# Patient Record
Sex: Female | Born: 1980 | Race: White | Hispanic: No | Marital: Married | Smoking: Former smoker
Health system: Southern US, Community
[De-identification: ages and names within clinical notes are randomized; demographics above are authoritative.]

## PROBLEM LIST (undated history)

## (undated) DIAGNOSIS — C50412 Malignant neoplasm of upper-outer quadrant of left female breast: Secondary | ICD-10-CM

## (undated) DIAGNOSIS — Z9221 Personal history of antineoplastic chemotherapy: Secondary | ICD-10-CM

## (undated) HISTORY — DX: Malignant neoplasm of upper-outer quadrant of left female breast: C50.412

## (undated) HISTORY — DX: Personal history of antineoplastic chemotherapy: Z92.21

---

## 2014-08-30 DIAGNOSIS — C50412 Malignant neoplasm of upper-outer quadrant of left female breast: Secondary | ICD-10-CM

## 2014-08-30 DIAGNOSIS — N63 Unspecified lump in unspecified breast: Secondary | ICD-10-CM | POA: Insufficient documentation

## 2014-08-30 HISTORY — DX: Malignant neoplasm of upper-outer quadrant of left female breast: C50.412

## 2014-09-01 DIAGNOSIS — C50419 Malignant neoplasm of upper-outer quadrant of unspecified female breast: Secondary | ICD-10-CM | POA: Insufficient documentation

## 2014-09-04 DIAGNOSIS — C50919 Malignant neoplasm of unspecified site of unspecified female breast: Secondary | ICD-10-CM | POA: Insufficient documentation

## 2014-10-03 DIAGNOSIS — Z79899 Other long term (current) drug therapy: Secondary | ICD-10-CM | POA: Insufficient documentation

## 2015-02-08 DIAGNOSIS — Z9189 Other specified personal risk factors, not elsewhere classified: Secondary | ICD-10-CM | POA: Insufficient documentation

## 2015-02-10 DIAGNOSIS — R0602 Shortness of breath: Secondary | ICD-10-CM | POA: Insufficient documentation

## 2015-03-05 HISTORY — PX: BREAST LUMPECTOMY WITH AXILLARY LYMPH NODE DISSECTION: SHX5756

## 2015-03-22 ENCOUNTER — Telehealth: Payer: Self-pay | Admitting: *Deleted

## 2015-03-22 NOTE — Telephone Encounter (Signed)
Dr. Marjory Lies office at [facility] called.  They are sending CD to [place] today

## 2015-03-27 ENCOUNTER — Ambulatory Visit: Payer: BLUE CROSS/BLUE SHIELD | Attending: Plastic and Reconstructive Surgery | Admitting: Physical Therapy

## 2015-03-27 ENCOUNTER — Encounter: Payer: Self-pay | Admitting: Physical Therapy

## 2015-03-27 DIAGNOSIS — M25612 Stiffness of left shoulder, not elsewhere classified: Secondary | ICD-10-CM | POA: Insufficient documentation

## 2015-03-27 DIAGNOSIS — Z9189 Other specified personal risk factors, not elsewhere classified: Secondary | ICD-10-CM | POA: Diagnosis present

## 2015-03-27 DIAGNOSIS — M25512 Pain in left shoulder: Secondary | ICD-10-CM | POA: Diagnosis present

## 2015-03-27 NOTE — Therapy (Signed)
[place], [place], [address] Phone: [phone]   Fax:  [phone]  Physical Therapy Evaluation  Patient Details  Name: Kathleen Wheeler MRN: [id number] Date of Birth: [birth year] Referring Provider:  Willodean Rosenthal, MD  Encounter Date: 03/27/2015      PT End of Session - 03/27/15 2024    Visit Number 1   Number of Visits 9   Date for PT Re-Evaluation 04/27/15   PT Start Time 2778   PT Stop Time 1528   PT Time Calculation (min) 53 min   Activity Tolerance Patient tolerated treatment well   Behavior During Therapy [facility] for tasks assessed/performed      History reviewed. No pertinent past medical history.  History reviewed. No pertinent past surgical history.  There were no vitals filed for this visit.  Visit Diagnosis:  Stiffness of joint, shoulder region, left - Plan: PT plan of care cert/re-cert  Pain in joint, shoulder region, left - Plan: PT plan of care cert/re-cert  At risk for lymphedema - Plan: PT plan of care cert/re-cert      Subjective Assessment - 03/27/15 1445    Subjective Noticed lump when she stopped nursing her son.  Mentioned it at her routine checkup.   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  Had neo-adjuvant chemotherapy finished 02/06/2015 with lumpectomy and ALND (20 nodes removed, all negative) on 03/05/2015. Treatment was at [facility].   Will start radiation here here; meets with rad onc on 04/04/15.    Otherwise healthy.  Has a son who is 28 months old.   Patient Stated Goals full range of motion in arm and no pain or discomfort; learn about lymphedema   Currently in Pain? Yes   Pain Score 5    Pain Location Axilla  and nipple   Pain Orientation Left   Pain Descriptors / Indicators Burning   Aggravating Factors  touch, moving arm   Pain Relieving Factors ice pack; tylenol            [facility] PT Assessment -  03/27/15 0001    Assessment   Medical Diagnosis left breast cancer, stage IIB   Onset Date/Surgical Date 03/05/15   Hand Dominance Right   Precautions   Precautions Other (comment)   Precaution Comments cancer precautions   Restrictions   Weight Bearing Restrictions No   Other Position/Activity Restrictions none   Balance Screen   Has the patient fallen in the past 6 months No   Has the patient had a decrease in activity level because of a fear of falling?  No   Is the patient reluctant to leave their home because of a fear of falling?  No   Home Social worker Private residence   Living Arrangements Children;Spouse/significant other   Type of [place] to enter   Prior Function   Level of Independence Independent   Vocation Part time employment   Environmental consultant at [employer]; no heavy lifting   Leisure no regular exercise currently; plays outside with her toddler; used to go to the gym a couple times a week; mostly cardio, on elliptical with  light arm/leg resistance   Observation/Other Assessments   Observations two incisions at left lateral breast + one drain site all well healed   Quick DASH  32   ROM / Strength   AROM / PROM / Strength AROM  AROM   Overall AROM Comments learned after surgery to walk arm up wall for abduction   AROM Assessment Site Shoulder   Right/Left Shoulder Right;Left   Right Shoulder Extension 60 Degrees   Right Shoulder Flexion 153 Degrees   Right Shoulder ABduction 196 Degrees   Right Shoulder Internal Rotation 57 Degrees   Right Shoulder External Rotation 120 Degrees   Left Shoulder Extension 62 Degrees   Left Shoulder Flexion 130 Degrees   Left Shoulder ABduction 139 Degrees   Left Shoulder Internal Rotation 73 Degrees   Left Shoulder External Rotation 93 Degrees           LYMPHEDEMA/ONCOLOGY QUESTIONNAIRE - 03/27/15 1511    Type   Cancer Type              Quick Dash - 03/27/15 0001    Open a tight or new jar Mild difficulty   Do heavy household chores (wash walls, wash floors) Mild difficulty   Wash your back Moderate difficulty   Use a knife to cut food Mild difficulty   Recreational activities in which you take some force or impact through your arm, shoulder, or hand (golf, hammering, tennis) Moderate difficulty   During the past week, to what extent has your arm, shoulder or hand problem interfered with your normal social activities with family, friends, neighbors, or groups? Slightly   During the past week, to what extent has your arm, shoulder or hand problem limited your work or other regular daily activities Slightly   Arm, shoulder, or hand pain. Moderate   Tingling (pins and needles) in your arm, shoulder, or hand Moderate   Difficulty Sleeping Moderate difficulty   DASH Score 31.82 %             [facility] Adult PT Treatment/Exercise - 03/27/15 0001    Self-Care   Self-Care Other Self-Care Comments   Other Self-Care Comments  where and how to obtain a compression sleeve; wait to start back at gym workouts, but avoid using arm handles on elliptical if she were to start that; encourage her toddler to climb up on her instead of lifting him up right now                PT Education - 03/27/15 2024    Education provided Yes   Education Details where and how to obtain a compression sleeve for lymphedema risk reduction   Person(s) Educated Patient   Methods Explanation;Handout   Comprehension Verbalized understanding                [place] - 03/27/15 2034    CC Long Term Goal  #1   Title Independent in HEP for left shoulder ROM and in strength ABC program.   Time 4   Period Weeks   Status New   CC Long Term Goal  #2   Title Knowledgeable about lymphedema risk reduction practices.   Time 4   Period Weeks   Status New   CC Long Term Goal  #3   Title Left shoulder active flexion to at least  150 degrees for improved overhead reach.   Baseline 130 at eval compared to 153 on right.   Time 4   Period Weeks   Status New   CC Long Term Goal  #4   Title left shoulder active abduction to at least 175 degrees for improved ADLs.   Baseline 139 on left compared to 196 (!) on right   Time 4  Period Weeks   Status New   CC Long Term Goal  #5   Title Reduce quick DASH score to <20 indicating improved function.   Baseline 32 at eval   Time 4   Period Weeks   Status New            Plan - 03/27/15 2025    Clinical Impression Statement This is a very pleasant young woman 3 weeks s/p lumpectomy and ALND for left breast cancer who reports discomfort and limited ROM and function of left UE.  She had neo-adjuvant chemotherapy and will start radiation soon.  She would like to have full function of her affected arm and would also like to be knowledgeable about lymphedema, which she has heard of but knows little about.  Having had ALND, she is at risk for lymphedema.   Pt will benefit from skilled therapeutic intervention in order to improve on the following deficits Impaired UE functional use;Decreased range of motion;Pain;Decreased knowledge of precautions   Rehab Potential Excellent   Clinical Impairments Affecting Rehab Potential will start radiation treatment soon   PT Frequency 2x / week   PT Duration 4 weeks  as needed   PT Treatment/Interventions ADLs/Self Care Home Management;Passive range of motion;Therapeutic exercise;Manual techniques;DME Instruction;Patient/family education   PT Next Visit Plan Go over ROM HEP (patient should learn some of this at [facility] class on 9/19); check on whether she obtained compression sleeve; begin PROM and manual techniques; later, do strength ABC program   Recommended Other Services to attend ABC class on 04/02/15   Consulted and Agree with Plan of Care Patient         Problem List There are no active problems to display for this  patient.   [place] 03/27/2015, 8:41 PM  [place], [place], [address] Phone: [phone]   Fax:  [place], PT 03/27/2015 8:41 PM

## 2015-03-30 ENCOUNTER — Ambulatory Visit: Payer: BLUE CROSS/BLUE SHIELD | Admitting: Physical Therapy

## 2015-03-30 DIAGNOSIS — M25612 Stiffness of left shoulder, not elsewhere classified: Secondary | ICD-10-CM

## 2015-03-30 DIAGNOSIS — M25512 Pain in left shoulder: Secondary | ICD-10-CM

## 2015-03-30 DIAGNOSIS — Z9189 Other specified personal risk factors, not elsewhere classified: Secondary | ICD-10-CM

## 2015-03-30 NOTE — Therapy (Signed)
[place], [place], [address] Phone: [phone]   Fax:  [phone]  Physical Therapy Treatment  Patient Details  Name: Kathleen Wheeler MRN: [id number] Date of Birth: 04/06/1981 Referring Provider:  Willodean Rosenthal, MD  Encounter Date: 03/30/2015      PT End of Session - 03/30/15 0938    Visit Number 2   Number of Visits 9   Date for PT Re-Evaluation 04/27/15   PT Start Time 0931   PT Stop Time 1015   PT Time Calculation (min) 44 min   Activity Tolerance Patient tolerated treatment well   Behavior During Therapy [place] for tasks assessed/performed      History reviewed. No pertinent past medical history.  History reviewed. No pertinent past surgical history.  There were no vitals filed for this visit.  Visit Diagnosis:  Stiffness of joint, shoulder region, left  Pain in joint, shoulder region, left  At risk for lymphedema      Subjective Assessment - 03/30/15 0936    Subjective No new complaints. Planning to attend ABC class Monday.    Currently in Pain? Yes   Pain Score 4    Pain Location Axilla   Pain Orientation Left   Pain Descriptors / Indicators Burning   Pain Onset 1 to 4 weeks ago   Pain Frequency Constant   Aggravating Factors  morning's,    Pain Relieving Factors tylenol     Treatment: Issued risk reduction for lymphedema sheet after review and education on how to reduce occurrence of lymphedema.  Manual therapy: to left shoulder for increased range of motion Prom all directions (flexion, abduction, IR, ER) with stretch holds at end ranges (within pain free ranges) for increased motions. Myofascial release to  anterior shoulder and axillary area and anterior shoulder for increased tissue extensibility. trigger point/knot palpated at bicep tendon, worked on trigger point release, stretching to release knot.  Exercises Supine with dowel rod: Flexion x 10 reps with holds  at end range for stretching abduction x 10 reps with holds at end range for stretching.        PT Education - 03/30/15 1247    Education provided Yes   Education Details Lymphedema risk reduction    Person(s) Educated Patient   Methods Explanation;Handout   Comprehension Verbalized understanding;Returned demonstration          [facility] Goals - 03/27/15 2034    CC Long Term Goal  #1   Title Independent in HEP for left shoulder ROM and in strength ABC program.   Time 4   Period Weeks   Status New   CC Long Term Goal  #2   Title Knowledgeable about lymphedema risk reduction practices.   Time 4   Period Weeks   Status New   CC Long Term Goal  #3   Title Left shoulder active flexion to at least 150 degrees for improved overhead reach.   Baseline 130 at eval compared to 153 on right.   Time 4   Period Weeks   Status New   CC Long Term Goal  #4   Title left shoulder active abduction to at least 175 degrees for improved ADLs.   Baseline 139 on left compared to 196 (!) on right   Time 4   Period Weeks   Status New   CC Long Term Goal  #5   Title Reduce quick DASH score to <20 indicating improved function.   Baseline 32  at eval   Time 4   Period Weeks   Status New            Plan - 03/30/15 0938    Pt will benefit from skilled therapeutic intervention in order to improve on the following deficits Impaired UE functional use;Decreased range of motion;Pain;Decreased knowledge of precautions   Rehab Potential Excellent   Clinical Impairments Affecting Rehab Potential will start radiation treatment soon   PT Frequency 2x / week   PT Duration 4 weeks  as needed   PT Treatment/Interventions ADLs/Self Care Home Management;Passive range of motion;Therapeutic exercise;Manual techniques;DME Instruction;Patient/family education   Consulted and Agree with Plan of Care Patient        Problem List There are no active problems to display for this patient.   Willow Ora 03/30/2015, 12:47 PM  Willow Ora, PTA, [place] [address] [place], [place] [address] [phone] 03/30/2015, 12:47 PM

## 2015-04-03 ENCOUNTER — Telehealth: Payer: Self-pay | Admitting: *Deleted

## 2015-04-03 ENCOUNTER — Ambulatory Visit: Payer: BLUE CROSS/BLUE SHIELD

## 2015-04-03 NOTE — Telephone Encounter (Signed)
CD from [facility] arrived 03/26/15  Gave to Dr. Pablo Ledger

## 2015-04-03 NOTE — Progress Notes (Signed)
Location of Breast Cancer:Upper-outer  quadrant  of left breast 5 mm. 7 o'clock  Histology per Pathology Report:Invasive ductal carcinoma 03/06/15 Left axillary lymph node negative for metastatic carcinoma. Multiple lymph nodes negative for metastatic carcinoma. Left breast excision reveals no residual carcinoma  Receptor Status: ER(+), PR (-), Her2-neu (-)  Did patient present with symptoms (if so, please note symptoms) or was this found on screening mammography?:Patient palpated a nodule in breast which she thought was a blocked milk duct and upon examination another area was visible in axilla which positive for cancer.  Past/Anticipated interventions by surgeon, if any: 03/05/15 1. Left axillary lymph node dissection 2.left partial mastectomy   Past/Anticipated interventions by medical oncology, if any: Chemotherapy:Weekly taxol and carboplatin. BRCA 1&2 negative  Plan to start tamoxifen 2 weeks after completion of radiation therapy.  Lymphedema issues, if any:No  Pain issues, if any: Discomfort.Removal of porta-cath on 04/04/2015.   SAFETY ISSUES:  Prior radiation? No  Pacemaker/ICD? No  Possible current pregnancy?Pre-menopausal  Is the patient on methotrexate? No  Current Complaints / other details:Married. Menarche age 42.G1P1.first full-term pregnacy age 28, a son. Family history negative for breast or female organ cancer. NKDA ?Great Aunts BP 110/61 mmHg  Pulse 70  Temp(Src) 97.6 F (36.4 C)  Ht 5' 9" (1.753 m)  Wt 155 lb 11.2 oz (70.625 kg)  BMI 22.98 kg/m2  SpO2 100%    Arlyss Repress, RN 04/03/2015,12:58 PM

## 2015-04-04 ENCOUNTER — Encounter: Payer: Self-pay | Admitting: Radiation Oncology

## 2015-04-04 ENCOUNTER — Ambulatory Visit
Admission: RE | Admit: 2015-04-04 | Discharge: 2015-04-04 | Disposition: A | Discharge status: Home / Self Care | Payer: BLUE CROSS/BLUE SHIELD | Source: Ambulatory Visit | Attending: Radiation Oncology | Admitting: Radiation Oncology

## 2015-04-04 VITALS — BP 110/61 | HR 70 | Temp 97.6°F | Ht 69.0 in | Wt 155.7 lb

## 2015-04-04 DIAGNOSIS — Z510 Encounter for antineoplastic radiation therapy: Secondary | ICD-10-CM | POA: Insufficient documentation

## 2015-04-04 DIAGNOSIS — C50912 Malignant neoplasm of unspecified site of left female breast: Secondary | ICD-10-CM | POA: Insufficient documentation

## 2015-04-04 DIAGNOSIS — Z170 Estrogen receptor positive status [ER+]: Secondary | ICD-10-CM | POA: Insufficient documentation

## 2015-04-04 DIAGNOSIS — C50412 Malignant neoplasm of upper-outer quadrant of left female breast: Secondary | ICD-10-CM

## 2015-04-04 DIAGNOSIS — Z9221 Personal history of antineoplastic chemotherapy: Secondary | ICD-10-CM | POA: Insufficient documentation

## 2015-04-04 DIAGNOSIS — Z9889 Other specified postprocedural states: Secondary | ICD-10-CM | POA: Insufficient documentation

## 2015-04-04 DIAGNOSIS — R590 Localized enlarged lymph nodes: Secondary | ICD-10-CM | POA: Insufficient documentation

## 2015-04-04 NOTE — Progress Notes (Signed)
Please see the Nurse Progress Note in the MD Initial Consult Encounter for this patient. 

## 2015-04-04 NOTE — Progress Notes (Signed)
Radiation Oncology         [phone]) [phone] ________________________________  Initial Outpatient Consultation - Date: 04/04/2015   Name: Kathleen Wheeler MRN: [id number]   DOB: [birth year]  REFERRING PHYSICIAN: Antonieta Pert*  DIAGNOSIS AND STAGE: T2N1 Invasive Ductal Carcinoma of the Left Breast  HISTORY OF PRESENT ILLNESS::Kathleen Wheeler is a 34 y.o. female who had a 1 month history of a palpable left breast lump. A mammogram and ultrasound came back abnormal. A biopsy of the left breast and an axillary lymph node on 08/30/14 showed Grade III invasive ductal carcinoma ER 2%, PR 2%, and HER2 negative. The lymph node was positive. A repeat IHC at [facility] was ER positive, PR negative, and HER2 negative. MRI of the breasts on 09/11/14 showed bilateral enhancing foci, the mass in the left breast measured 2.6 cm. PET/CT on 09/13/14 showed no evidence of distant metastases. She has completed neoadjuvant chemotherapy with 4 cycles of adriamycin/cyclophosphamide on 10/31/14 and and weekly taxol/carbo on 02/06/15. PET scan on 02/08/15 showed left lateral breasts masses and lymphadenopathy.  On 02/27/15, a biopsy of the left breast came back as negative and on 03/05/15 she had a lumpectomy and an axillary node dissection that showed 0 out of 15 left axillary lymph nodes positive. Her lumpectomy showed no evidence for residual carcinoma. She has been referred to physical therapy. She is now ready to begin radiation. Her care up until now has been at [facility] under the care of [place] and Dr. Rip Harbour. She lives in [place] and prefer to receive her radiation treatment closer to home.  She is accompanied by her husband. She is ecstatic regarding her cosmetic result and pathologic complete response.  She has healed well and would like to proceed with radiation ASAP. She had her port removed yesterday. She reports a "tightness" in her left shoulder when she moves her arm and is working with physical therapy.   PREVIOUS  RADIATION THERAPY: No  Past medical, social and family history were reviewed in the electronic chart. Review of symptoms was reviewed in the electronic chart. Medications were reviewed in the electronic chart.     PHYSICAL EXAM:  Filed Vitals:   04/04/15 1514  BP: 110/61  Pulse: 70  Temp: 97.6 F (36.4 C)  .155 lb 11.2 oz (70.625 kg). Excellent cosmetic result. Incision in the upper outer quadrant of the left breast that is well healed and a axillary incision underneath the left arm that is well healed. No palpable abnormalities of the right breast. No palpable cervical, supraclavicular, or axillary adenopathy. Excellent ROM in both arms. Alert and oriented times three.  IMPRESSION: T2N1 Invasive Ductal Carcinoma of the Left Breast  PLAN: I spoke to the patient today regarding her diagnosis and options for treatment. We discussed the equivalence in terms of survival and local failure between mastectomy and breast conservation. We discussed the role of radiation in decreasing local failures in patients who undergo lumpectomy, even in the setting of a pathologic complete response.  We discussed the retrospective series showing unacceptable failure rates in patients who did not receive radiation after a pathologic complete response. . We discussed the process of simulation and the placement tattoos. We discussed 6 weeks of treatment as an outpatient including her breast, supraclavicular foss. We discussed the possibility of asymptomatic lung damage. We discussed the low likelihood of secondary malignancies. We discussed the possible side effects including but not limited to skin redness, fatigue, permanent skin darkening, and breast swelling.  We discussed the  use of cardiac sparing with deep inspiration breath hold if needed.  She signed informed consent and will be scheduled for simulation this week or next.   I spent 40 minutes  face to face with the patient and more than 50% of that time was  spent in counseling and/or coordination of care.  This document serves as a record of services personally performed by Thea Silversmith, MD. It was created on her behalf by Darcus Austin, a trained medical scribe. The creation of this record is based on the scribe's personal observations and the provider's statements to them. This document has been checked and approved by the attending provider.   ------------------------------------------------  Thea Silversmith, MD

## 2015-04-05 ENCOUNTER — Ambulatory Visit: Payer: BLUE CROSS/BLUE SHIELD | Admitting: Physical Therapy

## 2015-04-05 ENCOUNTER — Encounter: Payer: Self-pay | Admitting: Physical Therapy

## 2015-04-05 DIAGNOSIS — M25612 Stiffness of left shoulder, not elsewhere classified: Secondary | ICD-10-CM

## 2015-04-05 DIAGNOSIS — M25512 Pain in left shoulder: Secondary | ICD-10-CM

## 2015-04-05 DIAGNOSIS — Z9189 Other specified personal risk factors, not elsewhere classified: Secondary | ICD-10-CM

## 2015-04-05 NOTE — Patient Instructions (Addendum)
Closed Chain: Shoulder Abduction / Adduction - on Wall   One hand on wall, step to side and return. Stepping causes shoulder to abduct and adduct. Step _3__ times, holding 10 seconds on the left side, _3__ times per day.  http://ss.exer.us/267   Copyright  VHI. All rights reserved.  Closed Chain: Shoulder Flexion / Extension - on Wall   Hands on wall, step backward. Return. Stepping causes shoulder flexion and extension Do _3__ times,  Hold 10 seconds,  _3__ times per day.  http://ss.exer.us/265   Copyright  VHI. All rights reserved.  Scapular Retraction (Standing)   With arms at sides, pinch shoulder blades together. Repeat _10___ times per set. Do __1__ sets per session. Do __3__ sessions per day.  http://orth.exer.us/945   Copyright  VHI. All rights reserved.

## 2015-04-06 NOTE — Therapy (Signed)
[place], [place], [address] Phone: [phone]   Fax:  [phone]  Physical Therapy Treatment  Patient Details  Name: Kathleen Wheeler MRN: [id number] Date of Birth: 05/22/1981 Referring Provider:  Willodean Rosenthal, MD  Encounter Date: 04/05/2015    History reviewed. No pertinent past medical history.  History reviewed. No pertinent past surgical history.  There were no vitals filed for this visit.  Visit Diagnosis:  Stiffness of joint, shoulder region, left  Pain in joint, shoulder region, left  At risk for lymphedema   Treatment: Therapeutic exercise -  Shoulder pulleys - flexion and abduction x2 minutes each with verbal cues for proper posture and technique. Rolling ball up wall to end ROM flexion x10 and abduction x10 after PT demo with verbal cues for technique. Scapular retraction x10 in front of mirror for self-correction and verbal cues to avoid shoulder hiking. Finger ladder - flexion and abduction x5 each to comfortable end ROM.  Education: Instructed patient with a home exercise program including shoulder stretching and scapular retraction with PT verbal cues, demonstration and handout.  Patient verbalized understanding and returned demo.  Manual therapy: PROM left shoulder in supine to patient tolerance al planes focused on flexion and abduction. Passive neural stretch to left UE.  PLAN: Clinical Impression Statement:      Patient is limited with flexion and abduction but progressing well. Limited some by port being removed and having some soreness there. She will benefit from continued PT to increase ROM and progress to strengthening after ROM is back to baseline.taken yesterday   Pt will benefit from skilled therapeutic intervention in order to improve on the following deficits:    Impaired UE functional use; Decreased range of motion; Pain; Decreased knowledge of  precautions            Rehab Potential       Excellent    PT Next Visit Plan       Continue ROM exercises and PROM; neural tension stretching       [facility] Goals - 03/27/15 2034    CC Long Term Goal  #1   Title Independent in HEP for left shoulder ROM and in strength ABC program.   Time 4   Period Weeks   Status New   CC Long Term Goal  #2   Title Knowledgeable about lymphedema risk reduction practices.   Time 4   Period Weeks   Status New   CC Long Term Goal  #3   Title Left shoulder active flexion to at least 150 degrees for improved overhead reach.   Baseline 130 at eval compared to 153 on right.   Time 4   Period Weeks   Status New   CC Long Term Goal  #4   Title left shoulder active abduction to at least 175 degrees for improved ADLs.   Baseline 139 on left compared to 196 (!) on right   Time 4   Period Weeks   Status New   CC Long Term Goal  #5   Title Reduce quick DASH score to <20 indicating improved function.   Baseline 32 at eval   Time 4   Period Weeks   Status New            Problem List Patient Active Problem List   Diagnosis Date Noted  . Breath shortness 02/10/2015  . At risk for physiological dysfunction 02/08/2015  . Polypharmacy 10/03/2014  . Infiltrating  ductal carcinoma of breast 09/04/2014  . Cancer of upper-outer quadrant of female breast 09/01/2014  . Breast lump 08/30/2014   Annia Friendly, PT 04/06/2015 8:18 PM  [place], [place], [address] Phone: [phone]   Fax:  [phone]

## 2015-04-10 ENCOUNTER — Ambulatory Visit: Payer: BLUE CROSS/BLUE SHIELD | Admitting: Physical Therapy

## 2015-04-10 DIAGNOSIS — Z9189 Other specified personal risk factors, not elsewhere classified: Secondary | ICD-10-CM

## 2015-04-10 DIAGNOSIS — M25612 Stiffness of left shoulder, not elsewhere classified: Secondary | ICD-10-CM

## 2015-04-10 DIAGNOSIS — M25512 Pain in left shoulder: Secondary | ICD-10-CM

## 2015-04-10 NOTE — Therapy (Signed)
[place], [place], [address] Phone: [phone]   Fax:  [phone]  Physical Therapy Treatment  Patient Details  Name: Kathleen Wheeler MRN: [id number] Date of Birth: 1981/04/30 Referring Provider:  Willodean Rosenthal, MD  Encounter Date: 04/10/2015      PT End of Session - 04/10/15 1619    Visit Number 4   Number of Visits 9   Date for PT Re-Evaluation 04/27/15   PT Start Time 6415   PT Stop Time 1608   PT Time Calculation (min) 45 min   Activity Tolerance Patient tolerated treatment well;Patient limited by pain   Behavior During Therapy [facility] for tasks assessed/performed      No past medical history on file.  No past surgical history on file.  There were no vitals filed for this visit.  Visit Diagnosis:  Stiffness of joint, shoulder region, left  Pain in joint, shoulder region, left  At risk for lymphedema      Subjective Assessment - 04/10/15 1524    Subjective Port removal incision is feeling better, less sore than it was.  I went and got my compression sleeve--it actually feels good.  Doing exercise twice a day.   Currently in Pain? Yes   Pain Score 3    Pain Location Axilla   Pain Orientation Left   Pain Descriptors / Indicators Numbness;Discomfort            [facility] PT Assessment - 04/10/15 0001    AROM   Left Shoulder Flexion 151 Degrees   Left Shoulder ABduction 175 Degrees                     [facility] Adult PT Treatment/Exercise - 04/10/15 0001    Lumbar Exercises: Supine   Other Supine Lumbar Exercises With arms in 90 degrees abduction, hooklying lower trunk rotation to right for left chest stretches.   Shoulder Exercises: Supine   Other Supine Exercises supine over foam roll, horizontal abduction prolonged hold with stretch x approx. 30 seconds; "V" shape with arms with prolonged hold x 5   Manual Therapy   Manual Therapy Myofascial release   Myofascial  Release Left UE myofascial pulling in supine to right sidelying, with left scapular mobilization in sidelying.   Passive ROM PROM left shoulder all planes in supine focused on flexion and abduction                PT Education - 04/10/15 1618    Education provided Yes   Education Details shoulder horizontal abduction stretches with lower trunk rotation to right, and over towel roll   Person(s) Educated Patient   Methods Explanation;Handout   Comprehension Returned demonstration                [facility] Goals - 04/10/15 1527    CC Long Term Goal  #1   Title Independent in HEP for left shoulder ROM and in strength ABC program.   Status Partially Met   CC Long Term Goal  #2   Title Knowledgeable about lymphedema risk reduction practices.   Status Achieved   CC Long Term Goal  #3   Title Left shoulder active flexion to at least 150 degrees for improved overhead reach.   Status Achieved   CC Long Term Goal  #4   Title left shoulder active abduction to at least 175 degrees for improved ADLs.   Status Achieved   CC Long Term Goal  #  5   Title Reduce quick DASH score to <20 indicating improved function.   Status On-going            Plan - 04/10/15 1619    Clinical Impression Statement Patient doing very well with treatment.  She has met 3 of 5 original goals, including shoulder AROM goals.  She reports improved function and feeling less discomfort compared to initial evaluation.  She reports doing HEP regularly.  She canceled this Friday's appointment and has two appointments next week.  We discussed that at that time, we may put her on hold a couple of weeks, and just have her check back in after that to make sure she continues to be progressing.   Pt will benefit from skilled therapeutic intervention in order to improve on the following deficits Impaired UE functional use;Decreased range of motion;Pain;Decreased knowledge of precautions   Rehab Potential  Excellent   Clinical Impairments Affecting Rehab Potential will start radiation treatment soon   PT Frequency 2x / week   PT Duration 4 weeks   PT Treatment/Interventions Passive range of motion;Manual techniques;Therapeutic exercise;Patient/family education   PT Next Visit Plan Repeat quick DASH.  Continue manual therapy for ROM; patient probably has enough home exercises, but consider instructing in strength ABC program for her to begin progressive strengthening.  She may be ready after two visits next week to be on hold for a couple of weeks or so, then to have a follow-up just to make sure she continues to progress and has not developed swelling.                             PT Home Exercise Plan See HEP   Consulted and Agree with Plan of Care Patient        Problem List Patient Active Problem List   Diagnosis Date Noted  . Breath shortness 02/10/2015  . At risk for physiological dysfunction 02/08/2015  . Polypharmacy 10/03/2014  . Infiltrating ductal carcinoma of breast 09/04/2014  . Cancer of upper-outer quadrant of female breast 09/01/2014  . Breast lump 08/30/2014    SALISBURY,DONNA 04/10/2015, 4:26 PM  [place], [place], [address] Phone: [phone]   Fax:  [place], PT 04/10/2015 4:26 PM

## 2015-04-10 NOTE — Patient Instructions (Signed)
Supine With Rotation   Lie on back with one knee drawn toward chest. Slowly bring bent leg across body until stretch is felt in lower back area  OR you can have both knees bent and drop them over to one side. Hold _30__ seconds. Repeat to other side. Repeat _1-2__ times per session. Do __2_ sessions per day.   Also, you can roll up a couple of bath towels and lie on that towel roll (towel roll along the spine, on the bed) with arms out to the sides. Feel a stretch at chest.  Hold 30 seconds, 1-2 times, once or twice a day.  Copyright  VHI. All rights reserved.

## 2015-04-12 ENCOUNTER — Ambulatory Visit
Admission: RE | Admit: 2015-04-12 | Discharge: 2015-04-12 | Disposition: A | Discharge status: Home / Self Care | Payer: BLUE CROSS/BLUE SHIELD | Source: Ambulatory Visit | Attending: Radiation Oncology | Admitting: Radiation Oncology

## 2015-04-12 DIAGNOSIS — Z510 Encounter for antineoplastic radiation therapy: Secondary | ICD-10-CM | POA: Diagnosis not present

## 2015-04-12 DIAGNOSIS — R590 Localized enlarged lymph nodes: Secondary | ICD-10-CM | POA: Diagnosis not present

## 2015-04-12 DIAGNOSIS — Z170 Estrogen receptor positive status [ER+]: Secondary | ICD-10-CM | POA: Diagnosis not present

## 2015-04-12 DIAGNOSIS — Z9889 Other specified postprocedural states: Secondary | ICD-10-CM | POA: Diagnosis not present

## 2015-04-12 DIAGNOSIS — Z9221 Personal history of antineoplastic chemotherapy: Secondary | ICD-10-CM | POA: Diagnosis not present

## 2015-04-12 DIAGNOSIS — C50412 Malignant neoplasm of upper-outer quadrant of left female breast: Secondary | ICD-10-CM

## 2015-04-12 DIAGNOSIS — C50912 Malignant neoplasm of unspecified site of left female breast: Secondary | ICD-10-CM | POA: Diagnosis present

## 2015-04-12 NOTE — Progress Notes (Signed)
Name: Kathleen Wheeler   MRN: [id number]  Date:  04/12/2015  DOB: [birth year]  Status:outpatient   DIAGNOSIS: Left Breast cancer  CONSENT VERIFIED: yes SET UP: Patient is setup supine  IMMOBILIZATION:  The following immobilization was used:Custom Moldable Pillow, breast board.  NARRATIVE: Ms. Kathleen Wheeler was brought to the [facility].  Identity was confirmed.  All relevant records and images related to the planned course of therapy were reviewed.  Then, the patient was positioned in a stable reproducible clinical set-up for radiation therapy.  Wires were placed to delineate the clinical extent of breast tissue. A wire was placed on the scar as well.  CT images were obtained.  An isocenter was placed. Skin markings were placed.  The position of the heart was then analyzed.  Due to the proximity of the heart to the chest wall, I felt she would benefit from deep inspiration breath hold for cardiac sparing.  She was then coached and rescanned in the breath hold position.  Acceptable cardiac sparing was achieved. The CT images were loaded into the planning software where the target and avoidance structures were contoured.  The radiation prescription was entered and confirmed. The patient was discharged in stable condition and tolerated simulation well.    TREATMENT PLANNING NOTE/3D Simulation Note Treatment planning then occurred. I have requested : MLC's, isodose plan, basic dose calculation  3D simulation was performed.  I personally constructed 3 complex treatment devices in the form of MLCs which will be used for beam modification and to protect critical structures including the heart and lung.  I have requested a dose volume histogram of the heart lung and tumor cavity.    Special treatment procedure was performed today due to the extra time and effort required by myself to plan and prepare this patient for deep inspiration breath hold technique.  I have determined cardiac sparing to  be of benefit to this patient to prevent long term cardiac damage due to radiation of the heart.  Bellows were placed on the patient's abdomen. To facilitate cardiac sparing, the patient was coached by the radiation therapists on breath hold techniques and breathing practice was performed. Practice waveforms were obtained. The patient was then scanned while maintaining breath hold in the treatment position.  This image was then transferred over to the imaging specialist. The imaging specialist then created a fusion of the free breathing and breath hold scans using the chest wall as the stable structure. I personally reviewed the fusion in axial, coronal and sagittal image planes.  Excellent cardiac sparing was obtained.  I felt the patient is an appropriate candidate for breath hold and the patient will be treated as such.  The image fusion was then reviewed with the patient to reinforce the necessity of reproducible breath hold.    Radiation Oncology         [phone] ________________________________  Name: Kathleen Wheeler MRN: [id number]  Date: 04/12/2015  DOB: October 19, 1980  Optical Surface Tracking Plan:  Since intensity modulated radiotherapy (IMRT) and 3D conformal radiation treatment methods are predicated on accurate and precise positioning for treatment, intrafraction motion monitoring is medically necessary to ensure accurate and safe treatment delivery.  The ability to quantify intrafraction motion without excessive ionizing radiation dose can only be performed with optical surface tracking. Accordingly, surface imaging offers the opportunity to obtain 3D measurements of patient position throughout IMRT and 3D treatments without excessive radiation exposure.  I am ordering optical surface tracking for this patient's upcoming  course of radiotherapy. ________________________________  Toney Sang, MD 04/12/2015 8:32 AM    Reference:   Particia Wheeler, et al.  Surface imaging-based analysis of intrafraction motion for breast radiotherapy patients.Journal of [place], n. 6, nov. 2014. ISSN 30051102.   Available at: <http://www.jacmp.org/index.php/jacmp/article/view/4957>.

## 2015-04-13 ENCOUNTER — Ambulatory Visit: Payer: BLUE CROSS/BLUE SHIELD | Admitting: Physical Therapy

## 2015-04-16 ENCOUNTER — Ambulatory Visit: Payer: BLUE CROSS/BLUE SHIELD | Attending: Plastic and Reconstructive Surgery | Admitting: Physical Therapy

## 2015-04-16 DIAGNOSIS — Z9189 Other specified personal risk factors, not elsewhere classified: Secondary | ICD-10-CM | POA: Diagnosis present

## 2015-04-16 DIAGNOSIS — M25612 Stiffness of left shoulder, not elsewhere classified: Secondary | ICD-10-CM | POA: Diagnosis not present

## 2015-04-16 DIAGNOSIS — M25512 Pain in left shoulder: Secondary | ICD-10-CM

## 2015-04-16 NOTE — Therapy (Signed)
[place], [place], [address] Phone: [phone]   Fax:  [phone]  Physical Therapy Treatment  Patient Details  Name: Kathleen Wheeler MRN: [id number] Date of Birth: Feb 05, 1981 Referring Provider:  Willodean Rosenthal, MD  Encounter Date: 04/16/2015      PT End of Session - 04/16/15 1625    Visit Number 5   Number of Visits 6  (to 9 if needed)   Date for PT Re-Evaluation 04/27/15   PT Start Time 1520   PT Stop Time 1610   PT Time Calculation (min) 50 min   Activity Tolerance Patient tolerated treatment well   Behavior During Therapy [facility] for tasks assessed/performed      No past medical history on file.  No past surgical history on file.  There were no vitals filed for this visit.  Visit Diagnosis:  Stiffness of joint, shoulder region, left  Pain in joint, shoulder region, left  At risk for lymphedema      Subjective Assessment - 04/16/15 1523    Subjective "I feel like I'm back to normal."   Currently in Pain? Yes   Pain Score 2    Pain Location Axilla   Pain Orientation Right   Pain Descriptors / Indicators Tender   Aggravating Factors  pressing on it   Pain Relieving Factors rest            [facility] PT Assessment - 04/16/15 0001    Observation/Other Assessments   Quick DASH  6.82              Quick Dash - 04/16/15 0001    Open a tight or new jar No difficulty   Do heavy household chores (wash walls, wash floors) No difficulty   Carry a shopping bag or briefcase No difficulty   Wash your back No difficulty   Use a knife to cut food No difficulty   Recreational activities in which you take some force or impact through your arm, shoulder, or hand (golf, hammering, tennis) No difficulty   During the past week, to what extent has your arm, shoulder or hand problem interfered with your normal social activities with family, friends, neighbors, or groups? Not at all   During the past week, to what extent has your arm, shoulder or hand problem limited your work or other regular daily activities Not at all   Arm, shoulder, or hand pain. Mild   Tingling (pins and needles) in your arm, shoulder, or hand Mild   Difficulty Sleeping Mild difficulty   DASH Score 6.82 %               [facility] Adult PT Treatment/Exercise - 04/16/15 0001    Exercises   Exercises Other Exercises   Other Exercises  Instructed in and patient performed strength ABC program:  all stretches (chest, shoulder, tricep, calf, hamstring, butterfly, quads, and back rotation), core (bridging, curls, superwoman, 10 reps each), and resistance exercises (chest press 2 lbs. x 10 x 2, squats 0 lbs. x 10 x 2, one arm row 2 lbs. x 10 x 2, standing side leg lift 0 lbs. x 10 x 2, scaption against wall 2 lbs. x 10 x 2, steps 0 lbs. x 10 x 2 each side, tricep kickbacks 2 lbs. x 10 x 2, calf raises 0 lbs. x 10 x 2, and bicep curls 3 lbs. x 10 x 2).  PT Education - 04/16/15 1625    Education provided Yes   Education Details strength ABC program exercises, how to progress, and using log   Person(s) Educated Patient   Methods Explanation;Demonstration;Handout   Comprehension Verbalized understanding;Returned demonstration                [facility] Goals - 04/16/15 1630    CC Long Term Goal  #5   Title Reduce quick DASH score to <20 indicating improved function.   Status Achieved            Plan - 04/16/15 1627    Clinical Impression Statement Patient's quick DASH score has improved to just 6.82 today and she reports feeling back to normal.  She did well learning strength ABC program, but was challenged with "superwoman" core exercise and with light weigh resistance on several of the resistive exercises.  She will most likely be ready to be put on hold at next visit for a couple of weeks, then follow-up with one check-up visit.Marland Kitchen                                                                                                                    Pt will benefit from skilled therapeutic intervention in order to improve on the following deficits Impaired UE functional use;Decreased range of motion;Pain;Decreased knowledge of precautions   Rehab Potential Excellent   PT Frequency 2x / week   PT Duration 4 weeks   PT Treatment/Interventions Therapeutic exercise;Patient/family education   PT Next Visit Plan Review strength ABC program if needed.  Check all goals.  Continue manual therapy for stretching.   Consulted and Agree with Plan of Care Patient        Problem List Patient Active Problem List   Diagnosis Date Noted  . Breath shortness 02/10/2015  . At risk for physiological dysfunction 02/08/2015  . Polypharmacy 10/03/2014  . Infiltrating ductal carcinoma of breast ([place]) 09/04/2014  . Cancer of upper-outer quadrant of female breast ([place]) 09/01/2014  . Breast lump 08/30/2014    Trendon Zaring 04/16/2015, 4:32 PM  [place], [place], [address] Phone: [phone]   Fax:  [place], PT 04/16/2015 4:32 PM

## 2015-04-18 ENCOUNTER — Ambulatory Visit: Payer: BLUE CROSS/BLUE SHIELD | Admitting: Physical Therapy

## 2015-04-18 DIAGNOSIS — Z9221 Personal history of antineoplastic chemotherapy: Secondary | ICD-10-CM | POA: Diagnosis not present

## 2015-04-18 DIAGNOSIS — R590 Localized enlarged lymph nodes: Secondary | ICD-10-CM | POA: Diagnosis not present

## 2015-04-18 DIAGNOSIS — M25612 Stiffness of left shoulder, not elsewhere classified: Secondary | ICD-10-CM | POA: Diagnosis not present

## 2015-04-18 DIAGNOSIS — C50912 Malignant neoplasm of unspecified site of left female breast: Secondary | ICD-10-CM | POA: Diagnosis present

## 2015-04-18 DIAGNOSIS — M25512 Pain in left shoulder: Secondary | ICD-10-CM

## 2015-04-18 DIAGNOSIS — Z170 Estrogen receptor positive status [ER+]: Secondary | ICD-10-CM | POA: Diagnosis not present

## 2015-04-18 DIAGNOSIS — Z9189 Other specified personal risk factors, not elsewhere classified: Secondary | ICD-10-CM

## 2015-04-18 DIAGNOSIS — Z9889 Other specified postprocedural states: Secondary | ICD-10-CM | POA: Diagnosis not present

## 2015-04-18 DIAGNOSIS — Z510 Encounter for antineoplastic radiation therapy: Secondary | ICD-10-CM | POA: Diagnosis not present

## 2015-04-18 NOTE — Therapy (Addendum)
[place], [place], [address] Phone: [phone]   Fax:  [phone]  Physical Therapy Treatment  Patient Details  Name: Kathleen Wheeler MRN: [id number] Date of Birth: [birth year] Referring Provider:  Willodean Rosenthal, MD  Encounter Date: 04/18/2015    Past Medical History  Diagnosis Date  . Breast cancer of upper-outer quadrant of left female breast ([place]) 08/30/2014  . History of antineoplastic chemotherapy 09/18/2014-02/06/2015    doxorubicin / cyclophosphamide x 4 cycles, followed by weekly paclitaxel / carboplatin x 12 (carbo stopped after 11the cycle due to neutropenia)    Past Surgical History  Procedure Laterality Date  . Breast lumpectomy with axillary lymph node dissection  03/05/2015    [facility] - Dr. Stasia Cavalier    There were no vitals filed for this visit.  Visit Diagnosis:  Stiffness of joint, shoulder region, left  Pain in joint, shoulder region, left  At risk for lymphedema                                       [facility] Clinic Goals - 04/18/15 1044    CC Long Term Goal  #1   Title Independent in HEP for left shoulder ROM and in strength ABC program.   Status Achieved   CC Long Term Goal  #2   Title Knowledgeable about lymphedema risk reduction practices.   Status Achieved   CC Long Term Goal  #3   Title Left shoulder active flexion to at least 150 degrees for improved overhead reach.   Status Achieved   CC Long Term Goal  #4   Title left shoulder active abduction to at least 175 degrees for improved ADLs.   Status Achieved   CC Long Term Goal  #5   Title Reduce quick DASH score to <20 indicating improved function.   Status Achieved            Problem List Patient Active Problem List   Diagnosis Date Noted  . Breath shortness 02/10/2015  . At risk for physiological dysfunction 02/08/2015  . Polypharmacy 10/03/2014  . Infiltrating ductal  carcinoma of breast ([place]) 09/04/2014  . Cancer of upper-outer quadrant of female breast ([place]) 09/01/2014  . Breast lump 08/30/2014    SALISBURY,DONNA 09/13/2015, 3:08 PM  [place], [place], [address] Phone: [phone]   Fax:  [place], PT 09/13/2015 3:08 PM  PHYSICAL THERAPY DISCHARGE SUMMARY  Visits from Start of Care: 6  Current functional level related to goals / functional outcomes: All goals achieved as noted above.   Remaining deficits: None; patient should continue to stretch to maintain or improve ROM of shoulder.   Education / Equipment: Home exercise program; lymphedema risk reduction education. Plan: Patient agrees to discharge.  Patient goals were met. Patient is being discharged due to meeting the stated rehab goals.  ?????  Patient called a few weeks after this last visit to report she was doing well and did not need to come in for follow-up.          Serafina Royals, PT 09/13/2015 3:08 PM

## 2015-04-19 ENCOUNTER — Ambulatory Visit
Admission: RE | Admit: 2015-04-19 | Discharge: 2015-04-19 | Disposition: A | Discharge status: Home / Self Care | Payer: BLUE CROSS/BLUE SHIELD | Source: Ambulatory Visit | Attending: Radiation Oncology | Admitting: Radiation Oncology

## 2015-04-19 DIAGNOSIS — Z510 Encounter for antineoplastic radiation therapy: Secondary | ICD-10-CM | POA: Diagnosis not present

## 2015-04-23 ENCOUNTER — Ambulatory Visit
Admission: RE | Admit: 2015-04-23 | Discharge: 2015-04-23 | Disposition: A | Discharge status: Home / Self Care | Payer: BLUE CROSS/BLUE SHIELD | Source: Ambulatory Visit | Attending: Radiation Oncology | Admitting: Radiation Oncology

## 2015-04-23 DIAGNOSIS — Z510 Encounter for antineoplastic radiation therapy: Secondary | ICD-10-CM | POA: Diagnosis not present

## 2015-04-24 ENCOUNTER — Encounter: Payer: Self-pay | Admitting: Radiation Oncology

## 2015-04-24 ENCOUNTER — Ambulatory Visit
Admission: RE | Admit: 2015-04-24 | Discharge: 2015-04-24 | Disposition: A | Discharge status: Home / Self Care | Payer: BLUE CROSS/BLUE SHIELD | Source: Ambulatory Visit | Attending: Radiation Oncology | Admitting: Radiation Oncology

## 2015-04-24 VITALS — BP 121/61 | HR 60 | Temp 97.8°F | Ht 69.0 in | Wt 152.8 lb

## 2015-04-24 DIAGNOSIS — Z510 Encounter for antineoplastic radiation therapy: Secondary | ICD-10-CM | POA: Diagnosis not present

## 2015-04-24 DIAGNOSIS — C50412 Malignant neoplasm of upper-outer quadrant of left female breast: Secondary | ICD-10-CM

## 2015-04-24 MED ORDER — RADIAPLEXRX EX GEL
Freq: Once | CUTANEOUS | Status: AC
  Administered 2015-04-24: 13:00:00 via TOPICAL

## 2015-04-24 MED ORDER — ALRA NON-METALLIC DEODORANT (RAD-ONC)
1.0000 "application " | Freq: Once | TOPICAL | Status: AC
  Administered 2015-04-24: 1 via TOPICAL

## 2015-04-24 NOTE — Progress Notes (Signed)
Weekly Management Note Current Dose: 3.6  Gy  Projected Dose: 61 Gy   Narrative:  The patient presents for routine under treatment assessment.  CBCT/MVCT images/Port film x-rays were reviewed.  The chart was checked. Anxious. Back pain x 2 weeks.   Physical Findings: Weight: 152 lb 12.8 oz (69.31 kg). Unchanged  Impression:  The patient is tolerating radiation.  Plan:  Continue treatment as planned. Try Ativan prior to RT. Monitor back pain. Will ask for massages. Refer to [facility] and survivorship

## 2015-04-24 NOTE — Addendum Note (Signed)
Encounter addended by: Thea Silversmith, MD on: 04/24/2015  7:32 PM<BR>     Documentation filed: Follow-up Section, LOS Section, Notes Section

## 2015-04-24 NOTE — Progress Notes (Addendum)
Kathleen Wheeler has completed 2 fractions to her left breast.  She reports having mid back pain that started on Friday and is rating at a 5/10.  She reports feeling short of breath and has ativan to take as needed.  She reports this feeling has been happening more often.  Her oxygen saturation today was 100%. She denies skin irritation.  She has been given the Radiation Therapy and You book to review.  BP 121/61 mmHg  Pulse 60  Temp(Src) 97.8 F (36.6 C) (Oral)  Ht 5\' 9"  (1.753 m)  Wt 152 lb 12.8 oz (69.31 kg)  BMI 22.55 kg/m2  LMP 04/06/2015

## 2015-04-25 ENCOUNTER — Ambulatory Visit
Admission: RE | Admit: 2015-04-25 | Discharge: 2015-04-25 | Disposition: A | Discharge status: Home / Self Care | Payer: BLUE CROSS/BLUE SHIELD | Source: Ambulatory Visit | Attending: Radiation Oncology | Admitting: Radiation Oncology

## 2015-04-25 DIAGNOSIS — Z510 Encounter for antineoplastic radiation therapy: Secondary | ICD-10-CM | POA: Diagnosis not present

## 2015-04-26 ENCOUNTER — Ambulatory Visit
Admission: RE | Admit: 2015-04-26 | Discharge: 2015-04-26 | Disposition: A | Discharge status: Home / Self Care | Payer: BLUE CROSS/BLUE SHIELD | Source: Ambulatory Visit | Attending: Radiation Oncology | Admitting: Radiation Oncology

## 2015-04-26 DIAGNOSIS — Z510 Encounter for antineoplastic radiation therapy: Secondary | ICD-10-CM | POA: Diagnosis not present

## 2015-04-27 ENCOUNTER — Ambulatory Visit
Admission: RE | Admit: 2015-04-27 | Discharge: 2015-04-27 | Disposition: A | Discharge status: Home / Self Care | Payer: BLUE CROSS/BLUE SHIELD | Source: Ambulatory Visit | Attending: Radiation Oncology | Admitting: Radiation Oncology

## 2015-04-27 DIAGNOSIS — Z510 Encounter for antineoplastic radiation therapy: Secondary | ICD-10-CM | POA: Diagnosis not present

## 2015-04-30 ENCOUNTER — Ambulatory Visit
Admission: RE | Admit: 2015-04-30 | Discharge: 2015-04-30 | Disposition: A | Discharge status: Home / Self Care | Payer: BLUE CROSS/BLUE SHIELD | Source: Ambulatory Visit | Attending: Radiation Oncology | Admitting: Radiation Oncology

## 2015-04-30 DIAGNOSIS — Z510 Encounter for antineoplastic radiation therapy: Secondary | ICD-10-CM | POA: Diagnosis not present

## 2015-05-01 ENCOUNTER — Ambulatory Visit
Admission: RE | Admit: 2015-05-01 | Discharge: 2015-05-01 | Disposition: A | Discharge status: Home / Self Care | Payer: BLUE CROSS/BLUE SHIELD | Source: Ambulatory Visit | Attending: Radiation Oncology | Admitting: Radiation Oncology

## 2015-05-01 ENCOUNTER — Other Ambulatory Visit: Payer: Self-pay | Admitting: Radiation Oncology

## 2015-05-01 ENCOUNTER — Ambulatory Visit: Payer: BLUE CROSS/BLUE SHIELD | Admitting: Radiation Oncology

## 2015-05-01 DIAGNOSIS — Z510 Encounter for antineoplastic radiation therapy: Secondary | ICD-10-CM | POA: Diagnosis not present

## 2015-05-01 MED ORDER — MAGIC MOUTHWASH: 5.0000 mL | Freq: Three times a day (TID) | ORAL | Status: DC | PRN

## 2015-05-02 ENCOUNTER — Ambulatory Visit
Admission: RE | Admit: 2015-05-02 | Discharge: 2015-05-02 | Disposition: A | Discharge status: Home / Self Care | Payer: BLUE CROSS/BLUE SHIELD | Source: Ambulatory Visit | Attending: Radiation Oncology | Admitting: Radiation Oncology

## 2015-05-02 DIAGNOSIS — Z510 Encounter for antineoplastic radiation therapy: Secondary | ICD-10-CM | POA: Diagnosis not present

## 2015-05-03 ENCOUNTER — Encounter: Payer: Self-pay | Admitting: Radiation Oncology

## 2015-05-03 ENCOUNTER — Ambulatory Visit
Admission: RE | Admit: 2015-05-03 | Discharge: 2015-05-03 | Disposition: A | Discharge status: Home / Self Care | Payer: BLUE CROSS/BLUE SHIELD | Source: Ambulatory Visit | Attending: Radiation Oncology | Admitting: Radiation Oncology

## 2015-05-03 VITALS — BP 116/80 | HR 64 | Temp 98.5°F | Ht 69.0 in | Wt 154.0 lb

## 2015-05-03 DIAGNOSIS — C50412 Malignant neoplasm of upper-outer quadrant of left female breast: Secondary | ICD-10-CM

## 2015-05-03 DIAGNOSIS — Z510 Encounter for antineoplastic radiation therapy: Secondary | ICD-10-CM | POA: Diagnosis not present

## 2015-05-03 NOTE — Progress Notes (Signed)
Weekly Management Note Current Dose: 16.2  Gy  Projected Dose: 61 Gy   Narrative:  The patient presents for routine under treatment assessment. She has completed 9 fractions to her left breast.  She denies symptoms of pain.  She is troubled with mouth sores that started over the weekend.  She has been using magic mouthwash 3 times a day but reports that 3 new mouth sores appeared this morning. She additionally reports trouble sleeping. The patient is currently administering Unisom, as her Ambien ran out.  She is also using radiaplex. She presented with anxiety.  Physical Findings: Weight is 154 lb (69.854 kg). The patient is alert and oriented. There is no significant changes to the status of overall health to be noted at this time. Breast: Mild left breast redness noted. No sign of infection.  Impression: Kathleen Wheeler is a 34 year old female presenting to clinic in regards to her malignant neoplasm of the upper-outer quadrant of the right female breast. The patient is tolerating radiation and managing reported symptoms appropriately. She understands the continue the application of radiaplex to area of treatment. She presents with anxiety about her cancer. 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  Plan: The patient is advised to continue treatment as planned. Healthy methods of management in regards to reported symptoms were reviewed in detail. She has been referred to [facility] and survivorship in the past and these resources will always be made available to her upon request. A social work consult is advised for fear of reoccurance and referall to the young womens support group to address expressed anxiety.   This document serves as a record of services personally performed by Thea Silversmith , MD. It was created on her behalf by Lenn Cal, a trained medical scribe. The creation of this record is based on the scribe's personal observations and the provider's statements to them.  This document has been checked and approved by the attending provider.   ------------------------------------------------  Thea Silversmith, MD

## 2015-05-03 NOTE — Progress Notes (Addendum)
Kathleen Wheeler has completed 9 fractions to her left breast.  She denies pain.  She has been having trouble with more sores that started over the weekend.  She has been using magic mouthwash 3 times a day but had 3 new ones appear this morning.  She also reports trouble sleeping and it currently taking Unisom.  She was taking Ambien but ran out.  The skin on her left breast is pink.  She is using radiaplex.  BP 116/80 mmHg  Pulse 64  Temp(Src) 98.5 F (36.9 C) (Oral)  Ht 5\' 9"  (1.753 m)  Wt 154 lb (69.854 kg)  BMI 22.73 kg/m2  LMP 04/06/2015

## 2015-05-04 ENCOUNTER — Ambulatory Visit
Admission: RE | Admit: 2015-05-04 | Discharge: 2015-05-04 | Disposition: A | Discharge status: Home / Self Care | Payer: BLUE CROSS/BLUE SHIELD | Source: Ambulatory Visit | Attending: Radiation Oncology | Admitting: Radiation Oncology

## 2015-05-04 DIAGNOSIS — Z510 Encounter for antineoplastic radiation therapy: Secondary | ICD-10-CM | POA: Diagnosis not present

## 2015-05-07 ENCOUNTER — Telehealth: Payer: Self-pay | Admitting: Oncology

## 2015-05-07 ENCOUNTER — Ambulatory Visit
Admission: RE | Admit: 2015-05-07 | Discharge: 2015-05-07 | Disposition: A | Discharge status: Home / Self Care | Payer: BLUE CROSS/BLUE SHIELD | Source: Ambulatory Visit | Attending: Radiation Oncology | Admitting: Radiation Oncology

## 2015-05-07 DIAGNOSIS — Z510 Encounter for antineoplastic radiation therapy: Secondary | ICD-10-CM | POA: Diagnosis not present

## 2015-05-07 NOTE — Telephone Encounter (Signed)
Called Kathleen Wheeler regarding her message about whether she can have a flu shot.  She said her Medical Oncology doctor has encouraged her to get one but she just wants to make sure it is ok with radiation.  Advised her that it OK to get a flu shot while getting radiation.

## 2015-05-08 ENCOUNTER — Encounter: Payer: Self-pay | Admitting: *Deleted

## 2015-05-08 ENCOUNTER — Ambulatory Visit
Admission: RE | Admit: 2015-05-08 | Discharge: 2015-05-08 | Disposition: A | Discharge status: Home / Self Care | Payer: BLUE CROSS/BLUE SHIELD | Source: Ambulatory Visit | Attending: Radiation Oncology | Admitting: Radiation Oncology

## 2015-05-08 VITALS — BP 102/50 | HR 74 | Resp 16 | Wt 154.5 lb

## 2015-05-08 DIAGNOSIS — C50412 Malignant neoplasm of upper-outer quadrant of left female breast: Secondary | ICD-10-CM

## 2015-05-08 DIAGNOSIS — Z510 Encounter for antineoplastic radiation therapy: Secondary | ICD-10-CM | POA: Diagnosis not present

## 2015-05-08 NOTE — Progress Notes (Signed)
[place] Work  Clinical Social Work was referred by Pension scheme manager for support/survivorship resources and assessment of psychosocial needs.  Clinical Social Worker contacted patient at home to offer support and assess for needs.  Patient stated she was experiencing some anxiety, but felt it was manageable.  CSw and patient discussed support services and the importance of support during and after treatment.  Patient stated "it would probably help to talk with someone".  Patient expressed interest in the young women's support group and FYNN.  Patient was not interested in individual counseling at this time, but was appreciative of the information.  CSW provided patient with contact information and encouraged her to call with any needs or concerns.  CSW will also add patients name to the next [facility] program list.           Johnnye Lana, MSW, LCSW, OSW-C Clinical Social Worker [facility] [phone]

## 2015-05-08 NOTE — Progress Notes (Signed)
Weekly Management Note Current Dose: 21.6  Gy  Projected Dose: 61 Gy   Narrative:  The patient presents for routine under treatment assessment.  CBCT/MVCT images/Port film x-rays were reviewed.  The chart was checked. Doing well. No complaints. Talked to Education officer, museum.   Physical Findings: Weight: 154 lb 8 oz (70.081 kg). Unchanged. Slightly pink skin.   Impression:  The patient is tolerating radiation.  Plan:  Continue treatment as planned. Continue RT. Continue radiaplex.

## 2015-05-08 NOTE — Progress Notes (Signed)
Weight and vitals stable. Reports occasional brief sharp shooting pain in her left breast but, she understands these are related to the healing process. No skin changes noted within left breast treatment field. Reports using radiaplex and alra as directed. Reports fatigue. She reports pacing herself and taking rest breaks can be difficult with a [age] year old. Reports mouth sores from last week have resolved.   BP 102/50 mmHg  Pulse 74  Resp 16  Wt 154 lb 8 oz (70.081 kg)  SpO2 100%  LMP 04/06/2015 Wt Readings from Last 3 Encounters:  05/08/15 154 lb 8 oz (70.081 kg)  05/03/15 154 lb (69.854 kg)  04/24/15 152 lb 12.8 oz (69.31 kg)

## 2015-05-09 ENCOUNTER — Ambulatory Visit: Payer: BLUE CROSS/BLUE SHIELD | Admitting: Physical Therapy

## 2015-05-09 ENCOUNTER — Ambulatory Visit
Admission: RE | Admit: 2015-05-09 | Discharge: 2015-05-09 | Disposition: A | Discharge status: Home / Self Care | Payer: BLUE CROSS/BLUE SHIELD | Source: Ambulatory Visit | Attending: Radiation Oncology | Admitting: Radiation Oncology

## 2015-05-09 DIAGNOSIS — Z510 Encounter for antineoplastic radiation therapy: Secondary | ICD-10-CM | POA: Diagnosis not present

## 2015-05-10 ENCOUNTER — Ambulatory Visit
Admission: RE | Admit: 2015-05-10 | Discharge: 2015-05-10 | Disposition: A | Discharge status: Home / Self Care | Payer: BLUE CROSS/BLUE SHIELD | Source: Ambulatory Visit | Attending: Radiation Oncology | Admitting: Radiation Oncology

## 2015-05-10 DIAGNOSIS — Z510 Encounter for antineoplastic radiation therapy: Secondary | ICD-10-CM | POA: Diagnosis not present

## 2015-05-11 ENCOUNTER — Ambulatory Visit
Admission: RE | Admit: 2015-05-11 | Discharge: 2015-05-11 | Disposition: A | Discharge status: Home / Self Care | Payer: BLUE CROSS/BLUE SHIELD | Source: Ambulatory Visit | Attending: Radiation Oncology | Admitting: Radiation Oncology

## 2015-05-11 ENCOUNTER — Telehealth: Payer: Self-pay | Admitting: Radiation Oncology

## 2015-05-11 DIAGNOSIS — Z510 Encounter for antineoplastic radiation therapy: Secondary | ICD-10-CM | POA: Diagnosis not present

## 2015-05-11 NOTE — Telephone Encounter (Signed)
Understand from Kathleen Wheeler patient expressed interest in setting up MyChart account. Phoned patient. Provided her with web address [email or web address], activation code: [email or web address], and technical support of [phone]. She verbalized understanding and expressed appreciation for the call.

## 2015-05-13 ENCOUNTER — Ambulatory Visit: Payer: BLUE CROSS/BLUE SHIELD

## 2015-05-14 ENCOUNTER — Ambulatory Visit
Admission: RE | Admit: 2015-05-14 | Discharge: 2015-05-14 | Disposition: A | Discharge status: Home / Self Care | Payer: BLUE CROSS/BLUE SHIELD | Source: Ambulatory Visit | Attending: Radiation Oncology | Admitting: Radiation Oncology

## 2015-05-14 ENCOUNTER — Ambulatory Visit: Payer: BLUE CROSS/BLUE SHIELD

## 2015-05-14 DIAGNOSIS — Z510 Encounter for antineoplastic radiation therapy: Secondary | ICD-10-CM | POA: Diagnosis not present

## 2015-05-15 ENCOUNTER — Ambulatory Visit
Admission: RE | Admit: 2015-05-15 | Discharge: 2015-05-15 | Disposition: A | Discharge status: Home / Self Care | Payer: BLUE CROSS/BLUE SHIELD | Source: Ambulatory Visit | Attending: Radiation Oncology | Admitting: Radiation Oncology

## 2015-05-15 ENCOUNTER — Encounter: Payer: Self-pay | Admitting: Radiation Oncology

## 2015-05-15 VITALS — BP 103/69 | HR 72 | Temp 98.0°F | Ht 69.0 in | Wt 154.9 lb

## 2015-05-15 DIAGNOSIS — C50412 Malignant neoplasm of upper-outer quadrant of left female breast: Secondary | ICD-10-CM | POA: Diagnosis not present

## 2015-05-15 DIAGNOSIS — Z510 Encounter for antineoplastic radiation therapy: Secondary | ICD-10-CM | POA: Diagnosis not present

## 2015-05-15 MED ORDER — RADIAPLEXRX EX GEL
Freq: Once | CUTANEOUS | Status: AC
  Administered 2015-05-15: 17:00:00 via TOPICAL

## 2015-05-15 NOTE — Progress Notes (Signed)
Kathleen Wheeler presents for her 17th fraction of radiation to her Left Breast. She reports some mild fatigue, that will at times comes on suddenly later in the afternoon and towards the end of the week. Her left anterior breast is red, and tender. Her upper part of her shoulder in the back is red and tender also. Her Left nipple is extremely sore, red and swollen and she rates her pain a 6/10. She is using the radiaplex as directed and I will supply her with some more today.

## 2015-05-15 NOTE — Addendum Note (Signed)
Encounter addended by: Ernst Spell, RN on: 05/15/2015  5:22 PM<BR>     Documentation filed: [employer], Inpatient MAR, Orders

## 2015-05-15 NOTE — Progress Notes (Signed)
Weekly Management Note Current Dose: 30.6  Gy  Projected Dose: 61 Gy   Narrative:  The patient presents for routine under treatment assessment.  CBCT/MVCT images/Port film x-rays were reviewed.  The chart was checked. Doing well. Nipple is sore.   Physical Findings: Weight: 154 lb 14.4 oz (70.262 kg). Unchanged. Slightly pink skin.   Impression:  The patient is tolerating radiation.  Plan:  Continue treatment as planned. Continue RT. Continue radiaplex. Add aquaphor to nipple at night.

## 2015-05-16 ENCOUNTER — Ambulatory Visit
Admission: RE | Admit: 2015-05-16 | Discharge: 2015-05-16 | Disposition: A | Discharge status: Home / Self Care | Payer: BLUE CROSS/BLUE SHIELD | Source: Ambulatory Visit | Attending: Radiation Oncology | Admitting: Radiation Oncology

## 2015-05-16 DIAGNOSIS — Z510 Encounter for antineoplastic radiation therapy: Secondary | ICD-10-CM | POA: Diagnosis not present

## 2015-05-17 ENCOUNTER — Ambulatory Visit
Admission: RE | Admit: 2015-05-17 | Discharge: 2015-05-17 | Disposition: A | Discharge status: Home / Self Care | Payer: BLUE CROSS/BLUE SHIELD | Source: Ambulatory Visit | Attending: Radiation Oncology | Admitting: Radiation Oncology

## 2015-05-17 DIAGNOSIS — Z510 Encounter for antineoplastic radiation therapy: Secondary | ICD-10-CM | POA: Diagnosis not present

## 2015-05-18 ENCOUNTER — Ambulatory Visit
Admission: RE | Admit: 2015-05-18 | Discharge: 2015-05-18 | Disposition: A | Discharge status: Home / Self Care | Payer: BLUE CROSS/BLUE SHIELD | Source: Ambulatory Visit | Attending: Radiation Oncology | Admitting: Radiation Oncology

## 2015-05-18 DIAGNOSIS — Z510 Encounter for antineoplastic radiation therapy: Secondary | ICD-10-CM | POA: Diagnosis not present

## 2015-05-21 ENCOUNTER — Ambulatory Visit
Admission: RE | Admit: 2015-05-21 | Discharge: 2015-05-21 | Disposition: A | Discharge status: Home / Self Care | Payer: BLUE CROSS/BLUE SHIELD | Source: Ambulatory Visit | Attending: Radiation Oncology | Admitting: Radiation Oncology

## 2015-05-21 DIAGNOSIS — Z510 Encounter for antineoplastic radiation therapy: Secondary | ICD-10-CM | POA: Diagnosis not present

## 2015-05-22 ENCOUNTER — Ambulatory Visit
Admission: RE | Admit: 2015-05-22 | Discharge: 2015-05-22 | Disposition: A | Discharge status: Home / Self Care | Payer: BLUE CROSS/BLUE SHIELD | Source: Ambulatory Visit | Attending: Radiation Oncology | Admitting: Radiation Oncology

## 2015-05-22 ENCOUNTER — Encounter: Payer: Self-pay | Admitting: Radiation Oncology

## 2015-05-22 VITALS — BP 101/87 | HR 63 | Temp 97.7°F | Ht 69.0 in | Wt 154.0 lb

## 2015-05-22 DIAGNOSIS — C50412 Malignant neoplasm of upper-outer quadrant of left female breast: Secondary | ICD-10-CM

## 2015-05-22 DIAGNOSIS — Z510 Encounter for antineoplastic radiation therapy: Secondary | ICD-10-CM | POA: Diagnosis not present

## 2015-05-22 NOTE — Progress Notes (Signed)
Weekly Management Note Current Dose: 39.6  Gy  Projected Dose: 61 Gy   Narrative:  The patient presents for routine under treatment assessment.  CBCT/MVCT images/Port film x-rays were reviewed.  The chart was checked. Doing well. No complaints. Would like to double up treatments to finish before Thanksgiving if possible.   Physical Findings: Weight: 154 lb (69.854 kg). Unchanged. Skin slightly dark.   Impression:  The patient is tolerating radiation.  Plan:  Continue treatment as planned. Continue radiaplex. OK to double up boost.

## 2015-05-22 NOTE — Progress Notes (Signed)
Kathleen Wheeler is here for her 22nd fraction of radiation to Left Breast. Dr. Pablo Ledger has seen her at the machines today, but she had several more questions and wanted to be seen again.

## 2015-05-23 ENCOUNTER — Ambulatory Visit
Admission: RE | Admit: 2015-05-23 | Discharge: 2015-05-23 | Disposition: A | Discharge status: Home / Self Care | Payer: BLUE CROSS/BLUE SHIELD | Source: Ambulatory Visit | Attending: Radiation Oncology | Admitting: Radiation Oncology

## 2015-05-23 DIAGNOSIS — Z510 Encounter for antineoplastic radiation therapy: Secondary | ICD-10-CM | POA: Diagnosis not present

## 2015-05-24 ENCOUNTER — Ambulatory Visit
Admission: RE | Admit: 2015-05-24 | Discharge: 2015-05-24 | Disposition: A | Discharge status: Home / Self Care | Payer: BLUE CROSS/BLUE SHIELD | Source: Ambulatory Visit | Attending: Radiation Oncology | Admitting: Radiation Oncology

## 2015-05-24 DIAGNOSIS — Z510 Encounter for antineoplastic radiation therapy: Secondary | ICD-10-CM | POA: Diagnosis not present

## 2015-05-25 ENCOUNTER — Ambulatory Visit
Admission: RE | Admit: 2015-05-25 | Discharge: 2015-05-25 | Disposition: A | Discharge status: Home / Self Care | Payer: BLUE CROSS/BLUE SHIELD | Source: Ambulatory Visit | Attending: Radiation Oncology | Admitting: Radiation Oncology

## 2015-05-25 DIAGNOSIS — Z510 Encounter for antineoplastic radiation therapy: Secondary | ICD-10-CM | POA: Diagnosis not present

## 2015-05-28 ENCOUNTER — Ambulatory Visit
Admission: RE | Admit: 2015-05-28 | Discharge: 2015-05-28 | Disposition: A | Discharge status: Home / Self Care | Payer: BLUE CROSS/BLUE SHIELD | Source: Ambulatory Visit | Attending: Radiation Oncology | Admitting: Radiation Oncology

## 2015-05-28 DIAGNOSIS — Z510 Encounter for antineoplastic radiation therapy: Secondary | ICD-10-CM | POA: Diagnosis not present

## 2015-05-29 ENCOUNTER — Encounter: Payer: Self-pay | Admitting: Radiation Oncology

## 2015-05-29 ENCOUNTER — Ambulatory Visit
Admission: RE | Admit: 2015-05-29 | Discharge: 2015-05-29 | Disposition: A | Discharge status: Home / Self Care | Payer: BLUE CROSS/BLUE SHIELD | Source: Ambulatory Visit | Attending: Radiation Oncology | Admitting: Radiation Oncology

## 2015-05-29 VITALS — BP 106/66 | HR 79 | Temp 97.5°F | Ht 69.0 in | Wt 154.4 lb

## 2015-05-29 DIAGNOSIS — C50412 Malignant neoplasm of upper-outer quadrant of left female breast: Secondary | ICD-10-CM

## 2015-05-29 DIAGNOSIS — Z510 Encounter for antineoplastic radiation therapy: Secondary | ICD-10-CM | POA: Diagnosis not present

## 2015-05-29 MED ORDER — RADIAPLEXRX EX GEL
Freq: Once | CUTANEOUS | Status: AC
  Administered 2015-05-29: 16:00:00 via TOPICAL

## 2015-05-29 NOTE — Progress Notes (Signed)
Weekly Management Note Current Dose: 39.6  Gy  Projected Dose: 61 Gy   Narrative:  The patient presents for routine under treatment assessment.  CBCT/MVCT images/Port film x-rays were reviewed.  The chart was checked. Doing well. Sore under arm and breast. Skin is peeling.   Physical Findings: Weight: 154 lb 6.4 oz (70.035 kg). Unchanged. Skin slightly dark. moist desquamation in axilla. Dry desquamation under breast.   Impression:  The patient is tolerating radiation.  Plan:  Continue treatment as planned. Continue radiaplex. Discussed post treatment skin care. Start neosporin plus pain. Discussed FYNN and survivorship.

## 2015-05-29 NOTE — Progress Notes (Signed)
Kathleen Wheeler is here for her 27th fraction of radiation to her Left Breast. She admits to some fatigue, and takes rest breaks during the day, and tries to go to bed earlier at night. Her Left anterior breast is hyperpigmented. She has several areas of peeling. Her axilla presents a dry peeling which she states started over the weekend. She also has peeling to her nipple area and under her breast. There is a small open, moist area under breast with the peeling. She is using her radiaplex cream as directed twice a day and I will provide her with another tube today.   BP 106/66 mmHg  Pulse 79  Temp(Src) 97.5 F (36.4 C)  Ht 5\' 9"  (1.753 m)  Wt 154 lb 6.4 oz (70.035 kg)  BMI 22.79 kg/m2

## 2015-05-30 ENCOUNTER — Ambulatory Visit
Admission: RE | Admit: 2015-05-30 | Discharge: 2015-05-30 | Disposition: A | Discharge status: Home / Self Care | Payer: BLUE CROSS/BLUE SHIELD | Source: Ambulatory Visit | Attending: Radiation Oncology | Admitting: Radiation Oncology

## 2015-05-30 DIAGNOSIS — Z510 Encounter for antineoplastic radiation therapy: Secondary | ICD-10-CM | POA: Diagnosis not present

## 2015-05-31 ENCOUNTER — Ambulatory Visit
Admission: RE | Admit: 2015-05-31 | Discharge: 2015-05-31 | Disposition: A | Discharge status: Home / Self Care | Payer: BLUE CROSS/BLUE SHIELD | Source: Ambulatory Visit | Attending: Radiation Oncology | Admitting: Radiation Oncology

## 2015-05-31 DIAGNOSIS — Z510 Encounter for antineoplastic radiation therapy: Secondary | ICD-10-CM | POA: Diagnosis not present

## 2015-06-01 ENCOUNTER — Ambulatory Visit
Admission: RE | Admit: 2015-06-01 | Discharge: 2015-06-01 | Disposition: A | Discharge status: Home / Self Care | Payer: BLUE CROSS/BLUE SHIELD | Source: Ambulatory Visit | Attending: Radiation Oncology | Admitting: Radiation Oncology

## 2015-06-01 DIAGNOSIS — Z510 Encounter for antineoplastic radiation therapy: Secondary | ICD-10-CM | POA: Diagnosis not present

## 2015-06-03 ENCOUNTER — Ambulatory Visit
Admission: RE | Admit: 2015-06-03 | Discharge: 2015-06-03 | Disposition: A | Discharge status: Home / Self Care | Payer: BLUE CROSS/BLUE SHIELD | Source: Ambulatory Visit | Attending: Radiation Oncology | Admitting: Radiation Oncology

## 2015-06-03 DIAGNOSIS — Z510 Encounter for antineoplastic radiation therapy: Secondary | ICD-10-CM | POA: Diagnosis not present

## 2015-06-04 ENCOUNTER — Ambulatory Visit: Payer: BLUE CROSS/BLUE SHIELD

## 2015-06-04 ENCOUNTER — Ambulatory Visit
Admission: RE | Admit: 2015-06-04 | Discharge: 2015-06-04 | Disposition: A | Discharge status: Home / Self Care | Payer: BLUE CROSS/BLUE SHIELD | Source: Ambulatory Visit | Attending: Radiation Oncology | Admitting: Radiation Oncology

## 2015-06-04 ENCOUNTER — Encounter: Payer: Self-pay | Admitting: Radiation Oncology

## 2015-06-04 DIAGNOSIS — Z510 Encounter for antineoplastic radiation therapy: Secondary | ICD-10-CM | POA: Diagnosis not present

## 2015-06-05 ENCOUNTER — Ambulatory Visit: Payer: BLUE CROSS/BLUE SHIELD

## 2015-06-06 ENCOUNTER — Ambulatory Visit: Payer: BLUE CROSS/BLUE SHIELD

## 2015-06-11 NOTE — Progress Notes (Signed)
  Radiation Oncology         [phone] ________________________________  Name: Kathleen Wheeler MRN: [id number]  Date: 06/04/2015  DOB: [birth year]  End of Treatment Note  Diagnosis:  Cancer of upper-outer quadrant of female breast [facility])   Staging form: Breast, AJCC 7th Edition     Clinical stage from 04/04/2015: Stage IIB (T2, N1, M0) - Signed by Thea Silversmith, MD on 04/04/2015     Indication for treatment:  Curative       Radiation treatment dates:   04/23/2015-06/04/2015  Site/dose:    Left chest wall / 50.4 Gray @ 1.8 Pearline Cables per fraction x 28 fractions Left Supraclavicular fossa / 56 Gray @1 .8 Pearline Cables per fraction x 25 fractions   Beams/energy:  Opposed Tangents / 6 MV photons Right anterior oblique / 6 MV photons  Narrative: The patient tolerated radiation treatment relatively well.   She had minimal dry desquamation and some fatigue.   Plan: The patient has completed radiation treatment. The patient will return to radiation oncology clinic for routine followup in one month. I advised them to call or return sooner if they have any questions or concerns related to their recovery or treatment.  ------------------------------------------------  Thea Silversmith, MD

## 2015-06-12 ENCOUNTER — Other Ambulatory Visit: Payer: Self-pay | Admitting: Adult Health

## 2015-06-12 DIAGNOSIS — C50412 Malignant neoplasm of upper-outer quadrant of left female breast: Secondary | ICD-10-CM

## 2015-07-19 ENCOUNTER — Ambulatory Visit
Admission: RE | Admit: 2015-07-19 | Discharge: 2015-07-19 | Disposition: A | Discharge status: Home / Self Care | Payer: BLUE CROSS/BLUE SHIELD | Source: Ambulatory Visit | Attending: Radiation Oncology | Admitting: Radiation Oncology

## 2015-07-19 ENCOUNTER — Encounter: Payer: Self-pay | Admitting: Radiation Oncology

## 2015-07-19 VITALS — BP 104/64 | HR 78 | Temp 97.8°F | Resp 12 | Wt 154.5 lb

## 2015-07-19 DIAGNOSIS — C50412 Malignant neoplasm of upper-outer quadrant of left female breast: Secondary | ICD-10-CM

## 2015-07-19 MED ORDER — LORAZEPAM 0.5 MG PO TABS: 0.5000 mg | ORAL_TABLET | Freq: Three times a day (TID) | ORAL | Status: DC | PRN

## 2015-07-19 NOTE — Progress Notes (Signed)
She is currently in no pain.   Pt complains of occasional fatigue .  Pt left breast- positive for Hyperpigmentation. Reports pulling in the left arm.  She has stopped her PT denies edema. Pt continues to apply Radiaplex as directed. BP 104/64 mmHg  Pulse 78  Temp(Src) 97.8 F (36.6 C) (Oral)  Resp 12  Wt 154 lb 8 oz (70.081 kg)  SpO2 100%  LMP 06/19/2015 Wt Readings from Last 3 Encounters:  07/19/15 154 lb 8 oz (70.081 kg)  05/29/15 154 lb 6.4 oz (70.035 kg)  05/22/15 154 lb (69.854 kg)   Pt reports: Yes No Comments  Tamoxifen [x]  []    Arimidex []  [x]    Mammogram [x]   Date:09/15/14 []     Med Onc[facility]elphia Cairo ([facility]) 08/29/15 Survivorship: 08/23/15 Haroldine Laws and survivorship pamphlets given.

## 2015-07-19 NOTE — Progress Notes (Signed)
   Department of Radiation Oncology  Phone:  [phone] Fax:        [phone]   Name: Kathleen Wheeler MRN: [id number]  DOB: Sep 20, 1980  Date: 07/19/2015  Follow Up Visit Note  Diagnosis: Cancer of upper-outer quadrant of female breast [facility])   Staging form: Breast, AJCC 7th Edition     Clinical stage from 04/04/2015: Stage IIB (T2, N1, M0) - Signed by Thea Silversmith, MD on 04/04/2015  Summary and Interval since last radiation: 04/23/2015-06/04/2015  Site/dose:    Left chest wall / 50.4 Gray @ 1.8 Pearline Cables per fraction x 28 fractions Left Supraclavicular fossa / 45 Gray @1 .8 Gray per fraction x 25 fractions  Interval History: Kathleen Wheeler presents today for routine followup. She is currently in no pain, but she complains of occasional fatigue. She reports pulling in the left arm and she has stopped her PT. She denies edema and continues to apply Radiaplex as directed. She is taking Tamoxifen and tolerating that well.  She is signed up for [facility]. She still has panic attacks which respond to Ativan and requests a refill.  Physical Exam:  Filed Vitals:   07/19/15 1407  BP: 104/64  Pulse: 78  Temp: 97.8 F (36.6 C)  TempSrc: Oral  Resp: 12  Weight: 154 lb 8 oz (70.081 kg)  SpO2: 100%  Skin well healed. Hyperpigmentation over the scar on the left breast.  IMPRESSION: Kathleen Wheeler is a 35 y.o. female with resolving acute effects of radiation treatment. She is doing well.  PLAN: She is doing well. We discussed the need for follow up every 4-6 months which she has scheduled.  We discussed the need for yearly mammograms which she can schedule with her OBGYN or with medical oncology. We discussed the need for sun protection in the treated area.  She can always call me with questions.  I will follow up with her on an as needed basis.  She has survivorship on 08/23/15 and her next appointment with med/onc is with Dr. Zelphia Cairo at [facility] on 08/29/15.  We discussed the post traumatic stress that comes  with treatment. I did give her a one time refill of her Ativan.   Thea Silversmith, MD  This document serves as a record of services personally performed by Thea Silversmith, MD. It was created on her behalf by Darcus Austin, a trained medical scribe. The creation of this record is based on the scribe's personal observations and the provider's statements to them. This document has been checked and approved by the attending provider.

## 2015-07-23 NOTE — Addendum Note (Signed)
Encounter addended by: Jenene Slicker, RN on: 07/23/2015  9:21 AM<BR>     Documentation filed: Notes Section

## 2015-08-23 ENCOUNTER — Encounter: Payer: BLUE CROSS/BLUE SHIELD | Admitting: Nurse Practitioner

## 2015-08-27 ENCOUNTER — Encounter: Payer: Self-pay | Admitting: Nurse Practitioner

## 2015-08-27 ENCOUNTER — Ambulatory Visit: Payer: BLUE CROSS/BLUE SHIELD | Admitting: Nurse Practitioner

## 2015-08-27 VITALS — BP 113/59 | HR 85 | Temp 97.9°F | Resp 18 | Ht 69.0 in | Wt 153.9 lb

## 2015-08-27 DIAGNOSIS — C50412 Malignant neoplasm of upper-outer quadrant of left female breast: Secondary | ICD-10-CM

## 2015-08-27 NOTE — Progress Notes (Signed)
CLINIC:  Cancer Survivorship   REASON FOR VISIT:  Routine follow-up post-treatment for a recent history of breast cancer.  BRIEF ONCOLOGIC HISTORY:    Cancer of upper-outer quadrant of female breast ([place])    Mammogram Left breast/ axilla: abnormality warranting further evaluation   08/30/2014 Initial Biopsy Left breast and axillary node bx: IDC, grade 3, ER+ (2% - moderate intensity), PR+ (2% - strong intensity), HER2/neu negative   09/07/2014 Procedure IHC repeated at [facility]: ER+ (Allred 4) / PR- (Allred 0), HER2/neu negative   09/11/2014 Breast MRI Bilateral enhancing foci (R breast 7 o'clock and L breast 7 o'clock) for which biopsy recommended.  At time of biopsy on 09/15/14, abnormalities no longer evident, so biopsies cancelled. On MRI, mass was 2.6 cm.     09/11/2014 Clinical Stage Stage IIB: T2 N1   09/13/2014 Imaging PET/CT: no evidence of distant metastases   09/18/2014 - 02/06/2015 Neo-Adjuvant Chemotherapy Dose dense doxorubicin and cyclophosphamide x 4 followed by weekly paclitaxel and carboplatin   10/31/2014 Procedure BRCA1 and BRCA2 negative (testing at [facility])   02/08/2015 PET scan interval resolution of disease in left lateral breast and LAD   02/27/2015 Procedure Left central core biopsy: negative   03/05/2015 Definitive Surgery Left lumpectomy and axillary LN dissection: No residual disease / pCR; 15 LN removed and negative for malignancy (0/15)   03/05/2015 Pathologic Stage complete pathologic response: ypT0 ypN0   04/23/2015 - 06/04/2015 Radiation Therapy Left chest wall / 50.4 Gray @ 1.8 Pearline Cables per fraction x 28 fractions;  Left Supraclavicular fossa / 45 Gray @1 .8 Gray per fraction x 25 fractions.    06/18/2015 -  Anti-estrogen oral therapy Tamoxifen 20 mg daily; planned duration of therapy 10 years.    INTERVAL HISTORY:  Kathleen Wheeler presents to the [facility] today for our initial meeting to review her survivorship care plan detailing her treatment course for breast  cancer, as well as monitoring long-term side effects of that treatment, education regarding health maintenance, screening, and overall wellness and health promotion.     Overall, Kathleen Wheeler reports feeling quite well since completing her radiation therapy approximately three months ago.  She denies any headache, cough, or bone pain.  She has had shortness of breath and undergone workup with no etiology revealed by Dr. Rip Harbour at [facility].  She reports thickening in her left breast following radiation but states the skin changes overlying her skin have improved. She continues with fatigue.  She has a good appetite and denies any weight loss.  She has resumed exercising.  She is tolerating her tamoxifen well without hot flashes or vaginal discharge.  She denies vaginal bleeding other than her menstrual cycle, which has returned following chemotherapy but is lighter in duration and flow.  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  REVIEW OF SYSTEMS:  General: Fatigue as above. Denies fever, chills, unintentional weight loss, or night sweats. HEENT: Denies visual changes, hearing loss, mouth sores or difficulty swallowing. Cardiac: Denies palpitations, chest pain, and lower extremity edema.  Respiratory: Shortness of breath, improving, as above. Denies wheeze. Breast: Thickening in left breast post radiation, otherwise, denies any new nodularity, masses, tenderness, nipple changes, or nipple discharge in either breast.  GI: Denies abdominal pain, constipation, diarrhea, nausea, or vomiting.  GU: Denies dysuria, hematuria, vaginal bleeding, vaginal discharge, or vaginal dryness.  Musculoskeletal: Denies joint or bone pain.  Neuro: Peripheral neuropathy in left great toe.  Denies recent fall.  Skin: Denies rash, pruritis, or open wounds.  Psych: Denies depression, anxiety, insomnia, or memory loss.   A 14-point review of systems was completed and was  negative, except as noted above.   ONCOLOGY TREATMENT TEAM:  1. Surgeon:  Dr. Stasia Cavalier at [facility] 2. Medical Oncologist: [facility]ip [facility] at [facility] 3. Radiation Oncologist: Dr. Pablo Ledger    PAST MEDICAL/SURGICAL HISTORY:  Past Medical History  Diagnosis Date  . Breast cancer of upper-outer quadrant of left female breast ([place]) 08/30/2014  . History of antineoplastic chemotherapy 09/18/2014-02/06/2015    doxorubicin / cyclophosphamide x 4 cycles, followed by weekly paclitaxel / carboplatin x 12 (carbo stopped after 11the cycle due to neutropenia)   Past Surgical History  Procedure Laterality Date  . Breast lumpectomy with axillary lymph node dissection  03/05/2015    [facility] - Dr. Stasia Cavalier     ALLERGIES:  No Known Allergies   CURRENT MEDICATIONS:  Current Outpatient Prescriptions on File Prior to Visit  Medication Sig Dispense Refill  . acetaminophen (TYLENOL) 325 MG tablet Take 650 mg by mouth 2 (two) times daily.    Marland Kitchen albuterol (PROAIR HFA) 108 (90 BASE) MCG/ACT inhaler Inhale 108 mcg into the lungs.    . cetirizine (ZYRTEC) 10 MG tablet Take 10 mg by mouth daily.    Marland Kitchen doxylamine, Sleep, (UNISOM) 25 MG tablet Take 25 mg by mouth at bedtime as needed.    Marland Kitchen emollient (RADIAGEL) gel Apply topically as needed for wound care.    Marland Kitchen LORazepam (ATIVAN) 0.5 MG tablet Take 1 tablet (0.5 mg total) by mouth every 8 (eight) hours as needed for anxiety. 45 tablet 0  . magic mouthwash SOLN Take 5 mLs by mouth 3 (three) times daily as needed for mouth pain. 250 mL 1  . tamoxifen (NOLVADEX) 20 MG tablet Take 20 mg by mouth.     No current facility-administered medications on file prior to visit.     ONCOLOGIC FAMILY HISTORY:  No family history on file.   GENETIC COUNSELING/TESTING: Yes, performed at [place]: BRCA1 and BRCA2 negative.   SOCIAL HISTORY:  Kathleen Wheeler is married and lives with her family in [place], [place].  She has 1 son who is 2.  Kathleen Wheeler is currently working  part time.  She is a former smoker and denies any current or history of illicit drug use.  She uses alcohol very rarely, none since diagnosis.   PHYSICAL EXAMINATION:  Vital Signs: Filed Vitals:   08/27/15 1620  BP: 113/59  Pulse: 85  Temp: 97.9 F (36.6 C)  Resp: 18   ECOG Performance Status: 0  General: Well-nourished, well-appearing female in no acute distress.  She is accompanied in clinic by her husband today.   HEENT: Head is atraumatic and normocephalic.  Pupils equal and reactive to light and accomodation. Conjunctivae clear without exudate.  Sclerae anicteric. Oral mucosa is pink, moist, and intact without lesions.  Oropharynx is pink without lesions or erythema.  Lymph: No cervical, supraclavicular, infraclavicular, or axillary lymphadenopathy noted on palpation.  Cardiovascular: Regular rate and rhythm without murmurs, rubs, or gallops. Respiratory: Clear to auscultation bilaterally. Chest expansion symmetric without accessory muscle use on inspiration or expiration.  GI: Abdomen soft and round. No tenderness to palpation. Bowel sounds normoactive in 4 quadrants.  GU: Deferred.  Neuro: No focal deficits. Steady gait.  Psych: Mood and affect normal and appropriate for situation.  Extremities: No edema, cyanosis, or clubbing.  Skin: Warm and dry. No open lesions noted.  LABORATORY DATA:  None for this visit.  DIAGNOSTIC IMAGING:  None for this visit.     ASSESSMENT AND PLAN:   1. 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  Kathleen Wheeler is doing well without clinical symptoms worrisome for disease recurrence and is tolerating her tamoxifen well. She will follow-up with her medical oncologist,  Dr. Rip Harbour, in July 2017 with history and physical examination per surveillance protocol.  She will continue her anti-estrogen therapy with tamoxifen as prescribed by Dr. Rip Harbour at this time. She was instructed to make Dr.Kimmick aware if she begins to experience any new or increased side effects of the medication.  Though the incidence is low, there is an associated risk of endometrial cancer with anti-estrogen therapies like Tamoxifen.  Kathleen Wheeler was encouraged to contact Dr. Rip Harbour with any vaginal bleeding while taking Tamoxifen. Other side effects of Tamoxifen were again reviewed with her as well. A comprehensive survivorship care plan and treatment summary was reviewed with the patient today detailing her breast cancer diagnosis, treatment course, potential late/long-term effects of treatment, appropriate follow-up care with recommendations for the future, and patient education resources.  A copy of this summary, along with a letter will be sent to the patient's primary care provider via in basket message after today's visit.  Ms. Sia is welcome to return to the Survivorship Clinic in the future, as needed; no follow-up will be scheduled at this time.    2. Cancer screening:  Due to Kathleen Wheeler's history and her age, she should receive screening for skin cancers, colon cancer (beginning at age 11), and gynecologic cancers.  The information and recommendations are listed on the patient's comprehensive care plan/treatment summary and were reviewed in detail with the patient.    3. Health maintenance and wellness promotion: Kathleen Wheeler was encouraged to consume 5-7 servings of fruits and vegetables per day. We reviewed the "Nutrition Rainbow" handout, as well as discussed  recommendations to maximize nutrition and minimize recurrence, such as increased intake of fruits, vegetables, lean proteins, and minimizing the intake of red meats and processed foods.  She was also encouraged to engage in moderate to vigorous exercise for 30 minutes per day most days of the week. We discussed the [employer] fitness program, which is designed for cancer survivors to help them become more physically fit after cancer treatments.  She was instructed to limit her alcohol consumption and continue to abstain from tobacco use.  A copy of the "Take Control of Your Health" brochure was given to her reinforcing these recommendations.   4. Support services/counseling: It is not uncommon for this period of the patient's cancer care trajectory to be one of many emotions and stressors.  Kathleen Wheeler is currently participating in the [facility] ("Finding Your New Normal") support group series designed for patients after they have completed treatment.  Kathleen Wheeler was encouraged to take advantage of our many other support services programs, support groups, and/or counseling in coping with her new life as a cancer survivor after completing anti-cancer treatment.  She was offered support today  through active listening and expressive supportive counseling.  She was given information regarding our available services and encouraged to contact me with any questions or for help enrolling in any of our support group/programs.    A total of 50 minutes of face-to-face time was spent with this patient with greater than 50% of that time in counseling and care-coordination.   Sylvan Cheese, NP  Survivorship Program [facility] [phone]   Note: PRIMARY CARE PROVIDER Roque Cash, MD [phone]

## 2017-09-23 ENCOUNTER — Other Ambulatory Visit: Payer: Self-pay

## 2017-09-23 ENCOUNTER — Encounter: Payer: Self-pay | Admitting: *Deleted

## 2017-09-23 ENCOUNTER — Ambulatory Visit
Admission: RE | Admit: 2017-09-23 | Discharge: 2017-09-23 | Disposition: A | Discharge status: Home / Self Care | Payer: BLUE CROSS/BLUE SHIELD | Source: Ambulatory Visit | Attending: Neurosurgery | Admitting: Neurosurgery

## 2017-09-23 ENCOUNTER — Ambulatory Visit: Payer: BLUE CROSS/BLUE SHIELD | Admitting: Certified Registered Nurse Anesthetist

## 2017-09-23 ENCOUNTER — Ambulatory Visit: Payer: BLUE CROSS/BLUE SHIELD

## 2017-09-23 ENCOUNTER — Encounter
Admission: RE | Disposition: A | Discharge status: Home / Self Care | Payer: Self-pay | Source: Ambulatory Visit | Attending: Neurosurgery

## 2017-09-23 DIAGNOSIS — M5116 Intervertebral disc disorders with radiculopathy, lumbar region: Secondary | ICD-10-CM | POA: Insufficient documentation

## 2017-09-23 DIAGNOSIS — M5416 Radiculopathy, lumbar region: Secondary | ICD-10-CM | POA: Diagnosis present

## 2017-09-23 DIAGNOSIS — Z79899 Other long term (current) drug therapy: Secondary | ICD-10-CM | POA: Diagnosis not present

## 2017-09-23 DIAGNOSIS — Z833 Family history of diabetes mellitus: Secondary | ICD-10-CM | POA: Insufficient documentation

## 2017-09-23 DIAGNOSIS — Z853 Personal history of malignant neoplasm of breast: Secondary | ICD-10-CM | POA: Insufficient documentation

## 2017-09-23 DIAGNOSIS — Z803 Family history of malignant neoplasm of breast: Secondary | ICD-10-CM | POA: Diagnosis not present

## 2017-09-23 DIAGNOSIS — M79605 Pain in left leg: Secondary | ICD-10-CM | POA: Insufficient documentation

## 2017-09-23 DIAGNOSIS — Z87891 Personal history of nicotine dependence: Secondary | ICD-10-CM | POA: Diagnosis not present

## 2017-09-23 DIAGNOSIS — Z808 Family history of malignant neoplasm of other organs or systems: Secondary | ICD-10-CM | POA: Diagnosis not present

## 2017-09-23 DIAGNOSIS — Z419 Encounter for procedure for purposes other than remedying health state, unspecified: Secondary | ICD-10-CM

## 2017-09-23 HISTORY — PX: LUMBAR LAMINECTOMY/DECOMPRESSION MICRODISCECTOMY: SHX5026

## 2017-09-23 LAB — POCT PREGNANCY, URINE: Preg Test, Ur: NEGATIVE

## 2017-09-23 SURGERY — LUMBAR LAMINECTOMY/DECOMPRESSION MICRODISCECTOMY 1 LEVEL
Anesthesia: General | Site: Spine Lumbar | Laterality: Left | Wound class: "Clean "

## 2017-09-23 MED ORDER — DEXAMETHASONE SODIUM PHOSPHATE 10 MG/ML IJ SOLN
INTRAMUSCULAR | Status: AC
  Filled 2017-09-23: qty 1

## 2017-09-23 MED ORDER — PROPOFOL 10 MG/ML IV BOLUS
INTRAVENOUS | Status: AC
  Filled 2017-09-23: qty 20

## 2017-09-23 MED ORDER — LIDOCAINE HCL (CARDIAC) 20 MG/ML IV SOLN
INTRAVENOUS | Status: DC | PRN
  Administered 2017-09-23: 60 mg via INTRAVENOUS

## 2017-09-23 MED ORDER — FAMOTIDINE 20 MG PO TABS
ORAL_TABLET | ORAL | Status: AC
  Administered 2017-09-23: 20 mg via ORAL
  Filled 2017-09-23: qty 1

## 2017-09-23 MED ORDER — BACITRACIN 50000 UNITS IM SOLR
INTRAMUSCULAR | Status: AC
  Filled 2017-09-23: qty 1

## 2017-09-23 MED ORDER — HYDROMORPHONE HCL 1 MG/ML IJ SOLN: 0.2500 mg | INTRAMUSCULAR | Status: DC | PRN

## 2017-09-23 MED ORDER — ROCURONIUM BROMIDE 100 MG/10ML IV SOLN
INTRAVENOUS | Status: DC | PRN
  Administered 2017-09-23: 10 mg via INTRAVENOUS

## 2017-09-23 MED ORDER — ACETAMINOPHEN 10 MG/ML IV SOLN
INTRAVENOUS | Status: AC
  Filled 2017-09-23: qty 100

## 2017-09-23 MED ORDER — METHYLPREDNISOLONE ACETATE 40 MG/ML IJ SUSP
INTRAMUSCULAR | Status: AC
  Filled 2017-09-23: qty 1

## 2017-09-23 MED ORDER — ACETAMINOPHEN 10 MG/ML IV SOLN
INTRAVENOUS | Status: DC | PRN
  Administered 2017-09-23: 1000 mg via INTRAVENOUS

## 2017-09-23 MED ORDER — OXYCODONE HCL 5 MG PO TABS: 5.0000 mg | ORAL_TABLET | Freq: Once | ORAL | Status: DC | PRN

## 2017-09-23 MED ORDER — MEPERIDINE HCL 50 MG/ML IJ SOLN: 6.2500 mg | INTRAMUSCULAR | Status: DC | PRN

## 2017-09-23 MED ORDER — MIDAZOLAM HCL 2 MG/2ML IJ SOLN
INTRAMUSCULAR | Status: DC | PRN
  Administered 2017-09-23: 2 mg via INTRAVENOUS

## 2017-09-23 MED ORDER — THROMBIN (RECOMBINANT) 5000 UNITS EX SOLR
CUTANEOUS | Status: AC
  Filled 2017-09-23: qty 5000

## 2017-09-23 MED ORDER — BUPIVACAINE HCL (PF) 0.5 % IJ SOLN
INTRAMUSCULAR | Status: AC
  Filled 2017-09-23: qty 30

## 2017-09-23 MED ORDER — BUPIVACAINE-EPINEPHRINE (PF) 0.5% -1:200000 IJ SOLN
INTRAMUSCULAR | Status: AC
  Filled 2017-09-23: qty 30

## 2017-09-23 MED ORDER — PHENYLEPHRINE HCL 10 MG/ML IJ SOLN
INTRAMUSCULAR | Status: DC | PRN
  Administered 2017-09-23 (×4): 100 ug via INTRAVENOUS

## 2017-09-23 MED ORDER — LIDOCAINE HCL (PF) 2 % IJ SOLN
INTRAMUSCULAR | Status: AC
  Filled 2017-09-23: qty 10

## 2017-09-23 MED ORDER — FAMOTIDINE 20 MG PO TABS
20.0000 mg | ORAL_TABLET | Freq: Once | ORAL | Status: AC
  Administered 2017-09-23: 20 mg via ORAL

## 2017-09-23 MED ORDER — CEFAZOLIN SODIUM-DEXTROSE 1-4 GM/50ML-% IV SOLN
INTRAVENOUS | Status: AC
  Filled 2017-09-23: qty 50

## 2017-09-23 MED ORDER — PROPOFOL 10 MG/ML IV BOLUS
INTRAVENOUS | Status: DC | PRN
  Administered 2017-09-23: 100 mg via INTRAVENOUS

## 2017-09-23 MED ORDER — FENTANYL CITRATE (PF) 100 MCG/2ML IJ SOLN
INTRAMUSCULAR | Status: DC | PRN
  Administered 2017-09-23: 100 ug via INTRAVENOUS
  Administered 2017-09-23: 50 ug via INTRAVENOUS

## 2017-09-23 MED ORDER — FENTANYL CITRATE (PF) 250 MCG/5ML IJ SOLN
INTRAMUSCULAR | Status: AC
  Filled 2017-09-23: qty 5

## 2017-09-23 MED ORDER — ROCURONIUM BROMIDE 50 MG/5ML IV SOLN
INTRAVENOUS | Status: AC
  Filled 2017-09-23: qty 1

## 2017-09-23 MED ORDER — CEFAZOLIN SODIUM-DEXTROSE 1-4 GM/50ML-% IV SOLN
1.0000 g | Freq: Once | INTRAVENOUS | Status: AC
  Administered 2017-09-23: 1 g via INTRAVENOUS

## 2017-09-23 MED ORDER — PROMETHAZINE HCL 25 MG/ML IJ SOLN: 6.2500 mg | INTRAMUSCULAR | Status: DC | PRN

## 2017-09-23 MED ORDER — KETAMINE HCL 50 MG/ML IJ SOLN
INTRAMUSCULAR | Status: DC | PRN
  Administered 2017-09-23: 50 mg via INTRAMUSCULAR

## 2017-09-23 MED ORDER — MIDAZOLAM HCL 2 MG/2ML IJ SOLN
INTRAMUSCULAR | Status: AC
  Filled 2017-09-23: qty 2

## 2017-09-23 MED ORDER — DEXAMETHASONE SODIUM PHOSPHATE 10 MG/ML IJ SOLN
INTRAMUSCULAR | Status: DC | PRN
  Administered 2017-09-23: 10 mg via INTRAVENOUS

## 2017-09-23 MED ORDER — SUCCINYLCHOLINE CHLORIDE 20 MG/ML IJ SOLN
INTRAMUSCULAR | Status: AC
  Filled 2017-09-23: qty 1

## 2017-09-23 MED ORDER — SODIUM CHLORIDE FLUSH 0.9 % IV SOLN
INTRAVENOUS | Status: AC
  Filled 2017-09-23: qty 30

## 2017-09-23 MED ORDER — BUPIVACAINE LIPOSOME 1.3 % IJ SUSP
INTRAMUSCULAR | Status: AC
  Filled 2017-09-23: qty 20

## 2017-09-23 MED ORDER — ONDANSETRON HCL 4 MG/2ML IJ SOLN
INTRAMUSCULAR | Status: DC | PRN
  Administered 2017-09-23: 4 mg via INTRAVENOUS

## 2017-09-23 MED ORDER — LACTATED RINGERS IV SOLN
INTRAVENOUS | Status: DC
  Administered 2017-09-23: 12:00:00 via INTRAVENOUS

## 2017-09-23 MED ORDER — OXYCODONE HCL 5 MG PO TABS: 5.0000 mg | ORAL_TABLET | ORAL | 0 refills | Status: AC | PRN

## 2017-09-23 MED ORDER — SUCCINYLCHOLINE CHLORIDE 20 MG/ML IJ SOLN
INTRAMUSCULAR | Status: DC | PRN
  Administered 2017-09-23: 100 mg via INTRAVENOUS

## 2017-09-23 MED ORDER — ONDANSETRON HCL 4 MG/2ML IJ SOLN
INTRAMUSCULAR | Status: AC
  Filled 2017-09-23: qty 2

## 2017-09-23 MED ORDER — METHOCARBAMOL 500 MG PO TABS: 500.0000 mg | ORAL_TABLET | Freq: Four times a day (QID) | ORAL | 0 refills | Status: AC | PRN

## 2017-09-23 MED ORDER — OXYCODONE HCL 5 MG/5ML PO SOLN: 5.0000 mg | Freq: Once | ORAL | Status: DC | PRN

## 2017-09-23 SURGICAL SUPPLY — 67 items
BLADE BOVIE TIP EXT 4 (BLADE) ×3 IMPLANT
BUR NEURO DRILL SOFT 3.0X3.8M (BURR) ×3 IMPLANT
CANISTER SUCT 1200ML W/VALVE (MISCELLANEOUS) ×6 IMPLANT
CHLORAPREP W/TINT 26ML (MISCELLANEOUS) ×6 IMPLANT
CNTNR SPEC 2.5X3XGRAD LEK (MISCELLANEOUS) ×1
CONT SPEC 4OZ STER OR WHT (MISCELLANEOUS) ×2
CONTAINER SPEC 2.5X3XGRAD LEK (MISCELLANEOUS) ×1 IMPLANT
COUNTER NEEDLE 20/40 LG (NEEDLE) ×3 IMPLANT
COVER LIGHT HANDLE STERIS (MISCELLANEOUS) ×6 IMPLANT
CUP MEDICINE 2OZ PLAST GRAD ST (MISCELLANEOUS) ×6 IMPLANT
DERMABOND ADVANCED (GAUZE/BANDAGES/DRESSINGS) ×2
DERMABOND ADVANCED .7 DNX12 (GAUZE/BANDAGES/DRESSINGS) ×1 IMPLANT
DRAPE C-ARM 42X72 X-RAY (DRAPES) ×6 IMPLANT
DRAPE LAPAROTOMY 100X77 ABD (DRAPES) ×3 IMPLANT
DRAPE MICROSCOPE SPINE 48X150 (DRAPES) ×3 IMPLANT
DRAPE POUCH INSTRU U-SHP 10X18 (DRAPES) ×3 IMPLANT
DRAPE SURG 17X11 SM STRL (DRAPES) ×12 IMPLANT
DRSG TEGADERM 4X4.75 (GAUZE/BANDAGES/DRESSINGS) IMPLANT
DRSG TELFA 4X3 1S NADH ST (GAUZE/BANDAGES/DRESSINGS) IMPLANT
ELECT CAUTERY BLADE TIP 2.5 (TIP) ×3
ELECT EZSTD 165MM 6.5IN (MISCELLANEOUS)
ELECT REM PT RETURN 9FT ADLT (ELECTROSURGICAL) ×3
ELECTRODE CAUTERY BLDE TIP 2.5 (TIP) ×1 IMPLANT
ELECTRODE EZSTD 165MM 6.5IN (MISCELLANEOUS) IMPLANT
ELECTRODE REM PT RTRN 9FT ADLT (ELECTROSURGICAL) ×1 IMPLANT
EVICEL AIRLESS SPRAY ACCES (MISCELLANEOUS) IMPLANT
FRAME EYE SHIELD (PROTECTIVE WEAR) ×3 IMPLANT
GLOVE BIO SURGEON STRL SZ 6.5 (GLOVE) ×4 IMPLANT
GLOVE BIO SURGEONS STRL SZ 6.5 (GLOVE) ×2
GLOVE BIOGEL PI IND STRL 7.0 (GLOVE) ×2 IMPLANT
GLOVE BIOGEL PI INDICATOR 7.0 (GLOVE) ×4
GLOVE SURG SYN 8.5  E (GLOVE) ×6
GLOVE SURG SYN 8.5 E (GLOVE) ×3 IMPLANT
GLOVE SURG SYN 8.5 PF PI (GLOVE) ×3 IMPLANT
GOWN SRG XL LVL 3 NONREINFORCE (GOWNS) ×1 IMPLANT
GOWN STRL NON-REIN TWL XL LVL3 (GOWNS) ×2
GOWN STRL REUS W/ TWL LRG LVL3 (GOWN DISPOSABLE) ×1 IMPLANT
GOWN STRL REUS W/TWL LRG LVL3 (GOWN DISPOSABLE) ×2
GRADUATE 1200CC STRL 31836 (MISCELLANEOUS) ×3 IMPLANT
KIT SPINAL PRONEVIEW (KITS) ×3 IMPLANT
KNIFE BAYONET SHORT DISCETOMY (MISCELLANEOUS) IMPLANT
MARKER SKIN DUAL TIP RULER LAB (MISCELLANEOUS) ×9 IMPLANT
NDL SAFETY ECLIPSE 18X1.5 (NEEDLE) ×1 IMPLANT
NEEDLE HYPO 18GX1.5 SHARP (NEEDLE) ×2
NEEDLE HYPO 22GX1.5 SAFETY (NEEDLE) ×3 IMPLANT
NS IRRIG 1000ML POUR BTL (IV SOLUTION) ×3 IMPLANT
PACK LAMINECTOMY NEURO (CUSTOM PROCEDURE TRAY) ×3 IMPLANT
PAD ARMBOARD 7.5X6 YLW CONV (MISCELLANEOUS) ×3 IMPLANT
PATTIES SURGICAL .5X1.5 (GAUZE/BANDAGES/DRESSINGS) IMPLANT
SPOGE SURGIFLO 8M (HEMOSTASIS) ×2
SPONGE SURGIFLO 8M (HEMOSTASIS) ×1 IMPLANT
STAPLER SKIN PROX 35W (STAPLE) IMPLANT
SUT DVC VLOC 3-0 CL 6 P-12 (SUTURE) ×3 IMPLANT
SUT NURALON 4 0 TR CR/8 (SUTURE) IMPLANT
SUT VIC AB 0 CT1 27 (SUTURE)
SUT VIC AB 0 CT1 27XCR 8 STRN (SUTURE) IMPLANT
SUT VIC AB 2-0 CT1 18 (SUTURE) ×3 IMPLANT
SUT VICRYL 0 AB UR-6 (SUTURE) ×3 IMPLANT
SYR 10ML LL (SYRINGE) ×3 IMPLANT
SYR 20CC LL (SYRINGE) ×3 IMPLANT
SYR 30ML LL (SYRINGE) ×6 IMPLANT
SYR 3ML LL SCALE MARK (SYRINGE) ×3 IMPLANT
TOWEL OR 17X26 4PK STRL BLUE (TOWEL DISPOSABLE) ×12 IMPLANT
TUBE MATRX SPINL 18MM 6CM DISP (INSTRUMENTS) ×2
TUBE METRX SPINAL 18X6 DISP (INSTRUMENTS) IMPLANT
TUBING CONNECTING 10 (TUBING) ×2 IMPLANT
TUBING CONNECTING 10' (TUBING) ×1

## 2017-09-23 NOTE — Final Progress Note (Signed)
Patient seen in recovery s/p L5/S1 decompression for lumbar radiculopathy. Still under effects of anesthesia, but states resolution of left leg pain that was present prior to surgery. No back pain at this time. Also denies any new presentation of extremity pain, numbness, or tingling.   Strength: 5/5 throughout  Sensation: intact and symmetric  Plan:  Will continue oxycodone and robaxin for continued pain control. Reviewed post op instructions with patient. All questions and concerns addressed. 2 week follow up in clinic already scheduled.

## 2017-09-23 NOTE — Anesthesia Procedure Notes (Signed)
Procedure Name: Intubation Date/Time: 09/23/2017 12:58 PM Performed by: Eben Burow, CRNA Pre-anesthesia Checklist: Patient identified, Emergency Drugs available, Suction available, Patient being monitored and Timeout performed Patient Re-evaluated:Patient Re-evaluated prior to induction Oxygen Delivery Method: Circle system utilized Preoxygenation: Pre-oxygenation with 100% oxygen Induction Type: IV induction Ventilation: Mask ventilation without difficulty Laryngoscope Size: Mac and 3 Grade View: Grade I Tube type: Oral Tube size: 7.0 mm Number of attempts: 1 Airway Equipment and Method: Stylet and LTA kit utilized Placement Confirmation: ETT inserted through vocal cords under direct vision,  positive ETCO2 and breath sounds checked- equal and bilateral Secured at: 21 cm Tube secured with: Tape Dental Injury: Teeth and Oropharynx as per pre-operative assessment

## 2017-09-23 NOTE — H&P (Signed)
I have reviewed and confirmed my history and physical from 09/17/2017 with no additions or changes. Plan for L L5/S1 microdiscectomy.  Risks and benefits reviewed.  Heart sounds normal no MRG. Chest Clear to Auscultation Bilaterally.

## 2017-09-23 NOTE — Anesthesia Post-op Follow-up Note (Signed)
Anesthesia QCDR form completed.        

## 2017-09-23 NOTE — Anesthesia Preprocedure Evaluation (Signed)
Anesthesia Evaluation  Patient identified by MRN, date of birth, ID band Patient awake    Reviewed: Allergy & Precautions, H&P , NPO status , reviewed documented beta blocker date and time   History of Anesthesia Complications Negative for: history of anesthetic complications  Airway Mallampati: I       Dental no notable dental hx.    Pulmonary former smoker,    Pulmonary exam normal        Cardiovascular negative cardio ROS Normal cardiovascular exam  ECHO 2016 EF>55%, no sig valvular findings   Neuro/Psych Per HPI negative psych ROS   GI/Hepatic negative GI ROS, Neg liver ROS,   Endo/Other  negative endocrine ROS  Renal/GU negative Renal ROS  negative genitourinary   Musculoskeletal Back/leg pain   Abdominal   Peds negative pediatric ROS (+)  Hematology negative hematology ROS (+) Hx of Breast Cancer   Anesthesia Other Findings   Reproductive/Obstetrics                             Anesthesia Physical Anesthesia Plan  ASA: II  Anesthesia Plan: General ETT   Post-op Pain Management:    Induction:   PONV Risk Score and Plan: 3 and Propofol infusion, Ondansetron, Midazolam and Dexamethasone  Airway Management Planned:   Additional Equipment:   Intra-op Plan:   Post-operative Plan:   Informed Consent: I have reviewed the patients History and Physical, chart, labs and discussed the procedure including the risks, benefits and alternatives for the proposed anesthesia with the patient or authorized representative who has indicated his/her understanding and acceptance.   Dental Advisory Given  Plan Discussed with: CRNA  Anesthesia Plan Comments:         Anesthesia Quick Evaluation

## 2017-09-23 NOTE — OR Nursing (Signed)
Discharge instructions discussed with pt and husband. Both voice understanding. 

## 2017-09-23 NOTE — Discharge Instructions (Signed)

## 2017-09-23 NOTE — Transfer of Care (Signed)
Immediate Anesthesia Transfer of Care Note  Patient: Kathleen Wheeler  Procedure(s) Performed: LUMBAR LAMINECTOMY/DECOMPRESSION MICRODISCECTOMY 1 LEVEL-L5-S1 (Left Spine Lumbar)  Patient Location: PACU  Anesthesia Type:General  Level of Consciousness: awake, alert , oriented and patient cooperative  Airway & Oxygen Therapy: Patient Spontanous Breathing and Patient connected to face mask oxygen  Post-op Assessment: Report given to RN and Post -op Vital signs reviewed and stable  Post vital signs: Reviewed and stable  Last Vitals:  Vitals:   09/23/17 1204  BP: 106/65  Pulse: 71  Resp: 18  Temp: (!) 36.4 C  SpO2: 100%    Last Pain:  Vitals:   09/23/17 1204  TempSrc: Oral  PainSc: 8          Complications: No apparent anesthesia complications

## 2017-09-23 NOTE — Progress Notes (Signed)
Pharmacy consulted to weight base dose of Cefazolin for surgical prophylaxis.  Ordered Cefazolin 1gm ONCE per protocol for weight of 69 KG.  Charlane Ferretti, RPh 09/23/2017

## 2017-09-23 NOTE — Op Note (Signed)
Indications: Mrs. Maiorino is a 37 yo female who presented with severe lumbar radiculopathy and failed conservative management.  She then elected for surgical intervention.  Findings: Large L L5-S1 disc herniation  Preoperative Diagnosis: Lumbar radiculopathy Postoperative Diagnosis: same   EBL: 10 ml IVF: 400 ml Drains: none Disposition: Extubated and Stable to PACU Complications: none  No foley catheter was placed.   Preoperative Note:   Risks of surgery discussed include: infection, bleeding, stroke, coma, death, paralysis, CSF leak, nerve/spinal cord injury, numbness, tingling, weakness, complex regional pain syndrome, recurrent stenosis and/or disc herniation, vascular injury, development of instability, neck/back pain, need for further surgery, persistent symptoms, development of deformity, and the risks of anesthesia. The patient understood these risks and agreed to proceed.  Operative Note:    The patient was then brought from the preoperative center with intravenous access established.  The patient underwent general anesthesia and endotracheal tube intubation, and was then rotated on the [place] rail top where all pressure points were appropriately padded.  The skin was then thoroughly cleansed.  Perioperative antibiotic prophylaxis was administered.  Sterile prep and drapes were then applied and a timeout was then observed.  C-arm was brought into the field under sterile conditions, and the L5-S1 disc space identified and marked with an incision on the left 1cm lateral to midline.  Once this was complete a 2 cm incision was opened with the use of a #10 blade knife.  The metrics tubes were sequentially advanced under lateral fluoroscopy until a 18 x 60 mm Metrix tube was placed over the facet and lamina and secured to the bed.    The microscope was then sterilely brought into the field and muscle creep was hemostased with a bipolar and resected with a pituitary rongeur.  A Bovie  extender was then used to expose the spinous process and lamina.  Careful attention was placed to not violate the facet capsule. A 3 mm matchstick drill bit was then used to make a hemi-laminotomy trough until the ligamentum flavum was exposed.  This was extended to the base of the spinous process.  Once this was complete and the underlying ligamentum flavum was visualized this was dissected with an up angle curette and resected with a #2 and #3 mm biting Kerrison.  The laminotomy opening was also expanded in similar fashion and hemostasis was obtained with Surgifoam and a patty as well as bone wax.  The rostral aspect of the caudal level of the lamina was also resected with a #2 biting Kerrison effort to further enhance exposure.  Once the underlying dura was visualized a Penfield 4 was then used to dissect and expose the traversing nerve root.  Once this was identified a nerve root retractor suction was used to mobilize this medially.  The venous plexus was hemostased with Surgifoam and light bipolar use. A small annulotomy within the disc space was made and disc space contents were noted to to come through the annulus.    The disc herniation was identified and dissected free using a balltip probe. The pituitary rongeur was used to remove the extruded disc fragments. Once the thecal sac and nerve root were noted to be relaxed and under less tension the ball-tipped feeler was passed along the foramen distally to to ensure no residual compression was noted.    A Depo-Medrol soaked Gelfoam pledget was placed along the nerve root for 2 minutes and removed.  The area was irrigated. The tube system was then removed under microscopic visualization  and hemostasis was obtained with a bipolar.    The fascial layer was reapproximated with the use of a 0- Vicryl suture.  Subcutaneous tissue layer was reapproximated using 2-0 Vicryl suture.  3-0 monocryl was used on the skin. The skin was then cleansed and Dermabond was  used to close the skin opening.  Patient was then rotated back to the preoperative bed awakened from anesthesia and taken to recovery all counts are correct in this case.   I performed the entire procedure with the assistance of Marin Olp PA as an Pensions consultant.  Meade Maw MD

## 2017-09-24 ENCOUNTER — Encounter: Payer: Self-pay | Admitting: Neurosurgery

## 2017-09-24 NOTE — Anesthesia Postprocedure Evaluation (Signed)
Anesthesia Post Note  Patient: MICHAELINA BLANDINO  Procedure(s) Performed: LUMBAR LAMINECTOMY/DECOMPRESSION MICRODISCECTOMY 1 LEVEL-L5-S1 (Left Spine Lumbar)  Patient location during evaluation: PACU Anesthesia Type: General Level of consciousness: awake and alert Pain management: pain level controlled Vital Signs Assessment: post-procedure vital signs reviewed and stable Respiratory status: spontaneous breathing, nonlabored ventilation and respiratory function stable Cardiovascular status: blood pressure returned to baseline and stable Postop Assessment: no apparent nausea or vomiting Anesthetic complications: no     Last Vitals:  Vitals:   09/23/17 1522 09/23/17 1605  BP: 118/77 118/69  Pulse: 68 73  Resp: 16 16  Temp:    SpO2: 100% 100%    Last Pain:  Vitals:   09/23/17 1204  TempSrc: Oral  PainSc: 8                  Dell Hurtubise Harvie Heck

## 2019-10-26 ENCOUNTER — Other Ambulatory Visit: Payer: Self-pay | Admitting: Student

## 2019-10-26 DIAGNOSIS — M5416 Radiculopathy, lumbar region: Secondary | ICD-10-CM

## 2019-10-27 ENCOUNTER — Ambulatory Visit: Payer: BC Managed Care – PPO

## 2019-11-04 ENCOUNTER — Other Ambulatory Visit: Payer: Self-pay | Admitting: Student

## 2019-11-04 DIAGNOSIS — M5416 Radiculopathy, lumbar region: Secondary | ICD-10-CM

## 2019-11-07 ENCOUNTER — Ambulatory Visit
Admission: RE | Admit: 2019-11-07 | Discharge: 2019-11-07 | Disposition: A | Discharge status: Home / Self Care | Payer: BC Managed Care – PPO | Source: Ambulatory Visit | Attending: Student | Admitting: Student

## 2019-11-07 ENCOUNTER — Other Ambulatory Visit: Payer: Self-pay

## 2019-11-07 DIAGNOSIS — M5416 Radiculopathy, lumbar region: Secondary | ICD-10-CM

## 2019-11-07 MED ORDER — METHYLPREDNISOLONE ACETATE 40 MG/ML INJ SUSP (RADIOLOG
120.0000 mg | Freq: Once | INTRAMUSCULAR | Status: AC
  Administered 2019-11-07: 08:00:00 120 mg via EPIDURAL

## 2019-11-07 MED ORDER — IOPAMIDOL (ISOVUE-M 200) INJECTION 41%
1.0000 mL | Freq: Once | INTRAMUSCULAR | Status: AC
  Administered 2019-11-07: 08:00:00 1 mL via EPIDURAL

## 2019-11-07 NOTE — Discharge Instructions (Signed)

## 2020-07-05 ENCOUNTER — Encounter: Payer: Self-pay | Admitting: Nurse Practitioner

## 2021-07-08 IMAGING — XA Imaging study
2 series · 2 of 2 positions shown · IV contrast (isovue)
Comparison: none

CLINICAL DATA: Lumbar radiculopathy. Displacement of the L5-S1
lumbar discs. Recurrent left paramedian disc protrusion. Left S1
radiculopathy.

EXAM:
EXAM
Left S1 NERVE ROOT BLOCK WITH TRANSFORAMINAL EPIDURAL STEROID
INJECTION
TECHNIQUE: Overlying skin prepped with Betadine, draped in the usual sterile
fashion. Curved 22 gauge spinal needle directed into the left S1
neuroforamen. Diagnostic injection with 3ml Isovue-M 200 contrast
demonstrates good epidural spread partially outlining the left S1
nerve root. No intravascular uptake. 120mg Depo-Medrol and 3ml 1%
lidocaine were subsequently administered. No immediate complication.
The patient tolerated the procedure without difficulty. They were
transferred to recovery in excellent condition.
FLUOROSCOPY TIME:  Radiation Exposure Index (as provided by the
fluoroscopic device): 19.57 uGy*m2

[Series 2: ortho standard · 1 of 1 slices shown (1 of 2)]
[im 1/1]
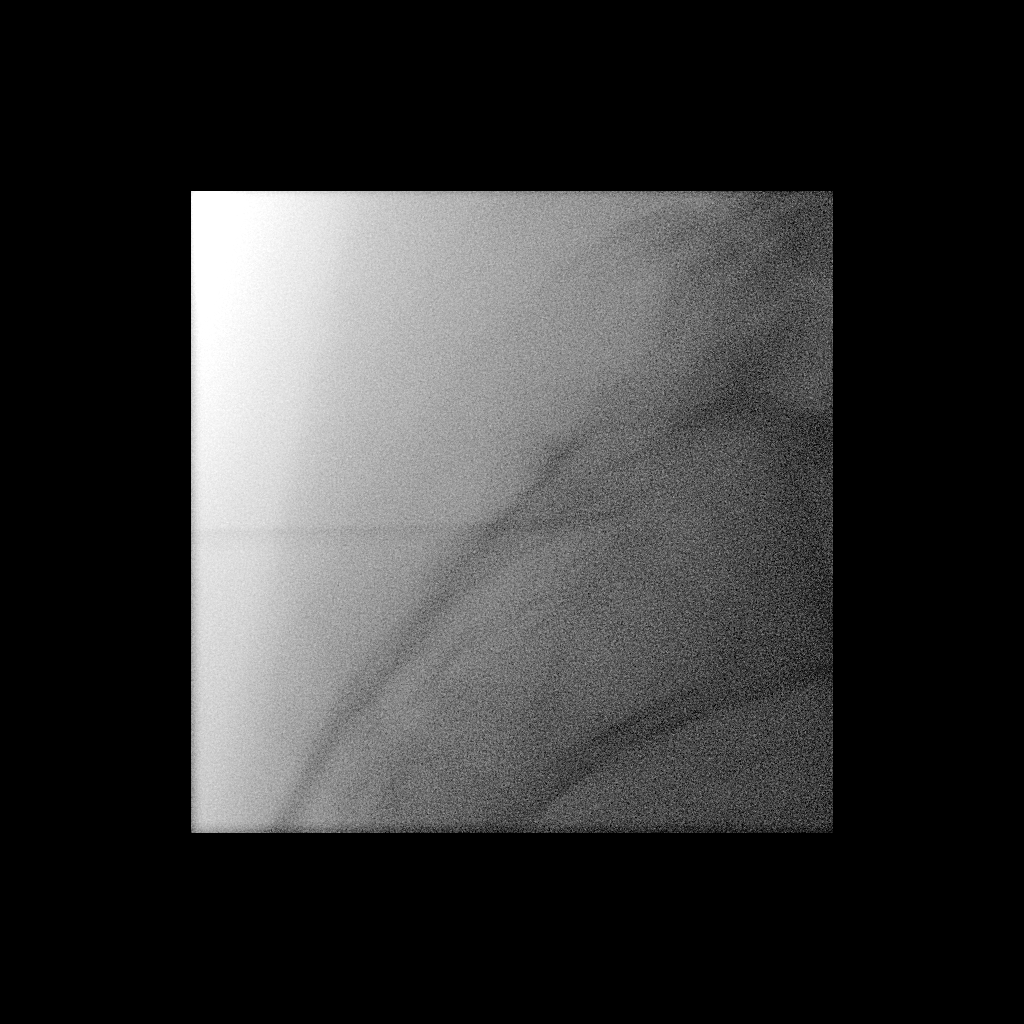

[Series 3: ortho standard · 1 of 1 slices shown (2 of 2)]
[im 1/1]
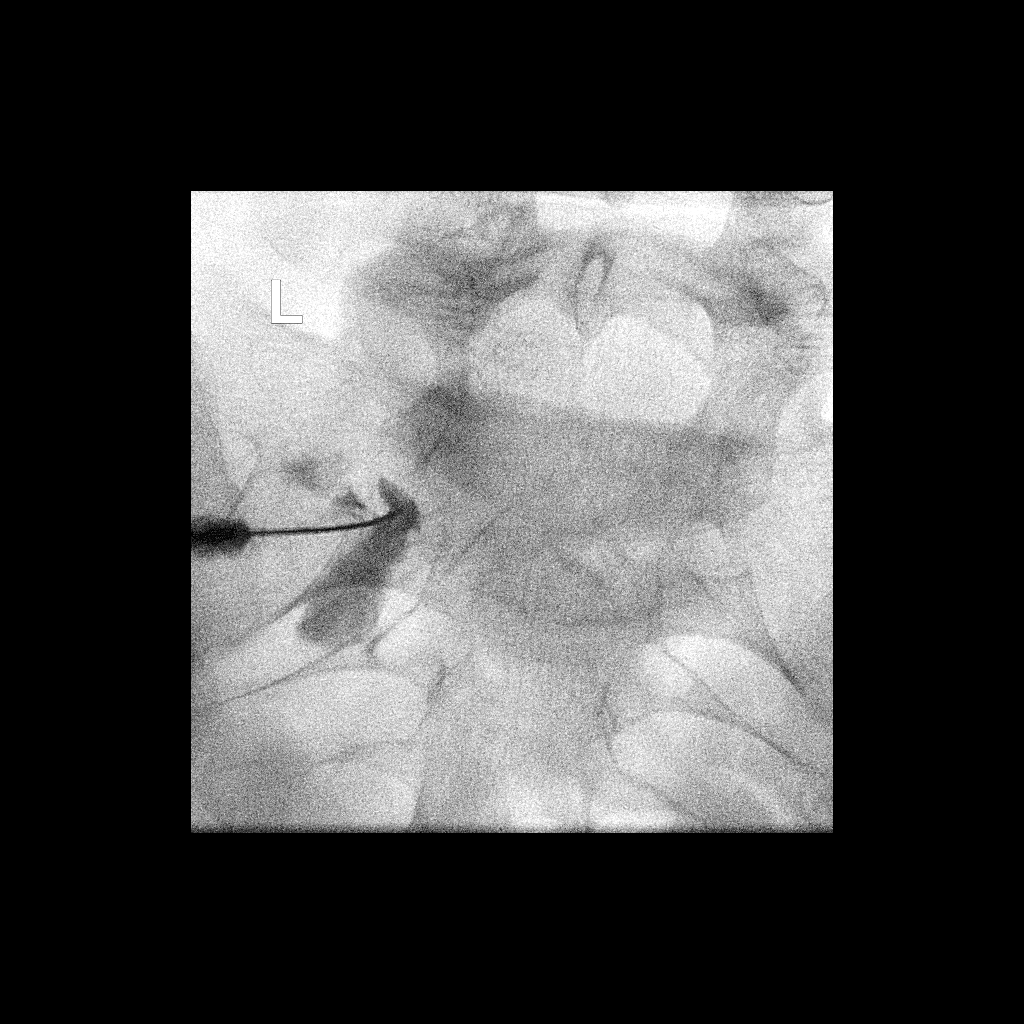

[2 of 2 positions shown; findings below may reference images not displayed]

IMPRESSION: Technically successful left ULselective nerve root block with
transforaminal epidural steroid injection.
